# Patient Record
Sex: Female | Born: 1960 | Race: White | Hispanic: No | Marital: Married | Smoking: Former smoker
Health system: Southern US, Community
[De-identification: ages and names within clinical notes are randomized; demographics above are authoritative.]

## PROBLEM LIST (undated history)

## (undated) DIAGNOSIS — M1990 Unspecified osteoarthritis, unspecified site: Secondary | ICD-10-CM

## (undated) DIAGNOSIS — R112 Nausea with vomiting, unspecified: Secondary | ICD-10-CM

## (undated) DIAGNOSIS — I10 Essential (primary) hypertension: Secondary | ICD-10-CM

## (undated) DIAGNOSIS — Z9889 Other specified postprocedural states: Secondary | ICD-10-CM

## (undated) DIAGNOSIS — G4730 Sleep apnea, unspecified: Secondary | ICD-10-CM

## (undated) DIAGNOSIS — J479 Bronchiectasis, uncomplicated: Secondary | ICD-10-CM

## (undated) DIAGNOSIS — F329 Major depressive disorder, single episode, unspecified: Secondary | ICD-10-CM

## (undated) DIAGNOSIS — I729 Aneurysm of unspecified site: Secondary | ICD-10-CM

## (undated) DIAGNOSIS — F32A Depression, unspecified: Secondary | ICD-10-CM

## (undated) DIAGNOSIS — Z8719 Personal history of other diseases of the digestive system: Secondary | ICD-10-CM

## (undated) DIAGNOSIS — H539 Unspecified visual disturbance: Secondary | ICD-10-CM

## (undated) HISTORY — PX: ABDOMINAL HYSTERECTOMY: SHX81

## (undated) HISTORY — PX: TUBAL LIGATION: SHX77

## (undated) HISTORY — PX: HIP FRACTURE SURGERY: SHX118

## (undated) HISTORY — PX: TONSILLECTOMY: SUR1361

## (undated) HISTORY — DX: Unspecified visual disturbance: H53.9

## (undated) HISTORY — PX: JOINT REPLACEMENT: SHX530

## (undated) HISTORY — PX: HAND SURGERY: SHX662

## (undated) HISTORY — PX: CYSTOSCOPY: SUR368

## (undated) HISTORY — PX: CHOLECYSTECTOMY: SHX55

---

## 2001-05-07 ENCOUNTER — Encounter: Admission: RE | Admit: 2001-05-07 | Discharge: 2001-05-07 | Payer: Self-pay | Admitting: *Deleted

## 2001-05-07 ENCOUNTER — Encounter: Payer: Self-pay | Admitting: Neurological Surgery

## 2003-04-07 ENCOUNTER — Encounter: Admission: RE | Admit: 2003-04-07 | Discharge: 2003-04-07 | Payer: Self-pay | Admitting: *Deleted

## 2004-04-12 ENCOUNTER — Encounter: Admission: RE | Admit: 2004-04-12 | Discharge: 2004-04-12 | Payer: Self-pay | Admitting: *Deleted

## 2006-05-29 ENCOUNTER — Ambulatory Visit (HOSPITAL_BASED_OUTPATIENT_CLINIC_OR_DEPARTMENT_OTHER): Admission: RE | Admit: 2006-05-29 | Discharge: 2006-05-29 | Payer: Self-pay | Admitting: Orthopedic Surgery

## 2006-09-10 ENCOUNTER — Ambulatory Visit: Payer: Self-pay | Admitting: Emergency Medicine

## 2006-10-18 ENCOUNTER — Ambulatory Visit: Payer: Self-pay | Admitting: Emergency Medicine

## 2006-11-04 DIAGNOSIS — R059 Cough, unspecified: Secondary | ICD-10-CM | POA: Insufficient documentation

## 2006-11-04 DIAGNOSIS — Z9089 Acquired absence of other organs: Secondary | ICD-10-CM

## 2006-11-04 DIAGNOSIS — M5136 Other intervertebral disc degeneration, lumbar region: Secondary | ICD-10-CM

## 2006-11-04 DIAGNOSIS — R05 Cough: Secondary | ICD-10-CM

## 2006-11-04 DIAGNOSIS — Z9079 Acquired absence of other genital organ(s): Secondary | ICD-10-CM | POA: Insufficient documentation

## 2006-11-04 DIAGNOSIS — R0609 Other forms of dyspnea: Secondary | ICD-10-CM | POA: Insufficient documentation

## 2006-11-04 DIAGNOSIS — R0602 Shortness of breath: Secondary | ICD-10-CM

## 2006-11-04 DIAGNOSIS — J329 Chronic sinusitis, unspecified: Secondary | ICD-10-CM | POA: Insufficient documentation

## 2006-11-04 DIAGNOSIS — E669 Obesity, unspecified: Secondary | ICD-10-CM

## 2006-11-13 ENCOUNTER — Ambulatory Visit: Payer: Self-pay | Admitting: Emergency Medicine

## 2006-11-22 ENCOUNTER — Ambulatory Visit: Payer: Self-pay | Admitting: Emergency Medicine

## 2006-12-13 ENCOUNTER — Encounter: Admission: RE | Admit: 2006-12-13 | Discharge: 2006-12-13 | Payer: Self-pay | Admitting: Family Medicine

## 2006-12-24 ENCOUNTER — Ambulatory Visit (HOSPITAL_COMMUNITY): Admission: RE | Admit: 2006-12-24 | Discharge: 2006-12-24 | Payer: Self-pay | Admitting: Orthopedic Surgery

## 2007-02-07 ENCOUNTER — Encounter (INDEPENDENT_AMBULATORY_CARE_PROVIDER_SITE_OTHER): Payer: Self-pay | Admitting: Orthopedic Surgery

## 2007-02-07 ENCOUNTER — Ambulatory Visit (HOSPITAL_BASED_OUTPATIENT_CLINIC_OR_DEPARTMENT_OTHER): Admission: RE | Admit: 2007-02-07 | Discharge: 2007-02-08 | Payer: Self-pay | Admitting: Orthopedic Surgery

## 2007-10-14 ENCOUNTER — Encounter: Admission: RE | Admit: 2007-10-14 | Discharge: 2007-10-14 | Payer: Self-pay | Admitting: Orthopedic Surgery

## 2009-10-07 ENCOUNTER — Encounter
Admission: RE | Admit: 2009-10-07 | Discharge: 2009-10-07 | Payer: Self-pay | Admitting: Physical Medicine and Rehabilitation

## 2010-05-08 ENCOUNTER — Encounter (HOSPITAL_BASED_OUTPATIENT_CLINIC_OR_DEPARTMENT_OTHER)
Admission: RE | Admit: 2010-05-08 | Discharge: 2010-05-08 | Disposition: A | Discharge status: Home / Self Care | Payer: BC Managed Care – PPO | Source: Ambulatory Visit | Attending: Orthopedic Surgery | Admitting: Orthopedic Surgery

## 2010-05-08 LAB — BASIC METABOLIC PANEL
BUN: 8 mg/dL (ref 6–23)
Chloride: 102 mEq/L (ref 96–112)
GFR calc Af Amer: >60 mL/min (ref >60)
Glucose, Bld: 99 mg/dL (ref 70–99)
Sodium: 137 mEq/L (ref 135–145)

## 2010-05-09 ENCOUNTER — Other Ambulatory Visit: Payer: Self-pay | Admitting: Orthopedic Surgery

## 2010-05-09 ENCOUNTER — Ambulatory Visit (HOSPITAL_BASED_OUTPATIENT_CLINIC_OR_DEPARTMENT_OTHER)
Admission: RE | Admit: 2010-05-09 | Discharge: 2010-05-09 | Disposition: A | Discharge status: Home / Self Care | Payer: BC Managed Care – PPO | Source: Ambulatory Visit | Attending: Orthopedic Surgery | Admitting: Orthopedic Surgery

## 2010-05-09 DIAGNOSIS — E669 Obesity, unspecified: Secondary | ICD-10-CM | POA: Insufficient documentation

## 2010-05-09 DIAGNOSIS — J449 Chronic obstructive pulmonary disease, unspecified: Secondary | ICD-10-CM | POA: Insufficient documentation

## 2010-05-09 DIAGNOSIS — K219 Gastro-esophageal reflux disease without esophagitis: Secondary | ICD-10-CM | POA: Insufficient documentation

## 2010-05-09 DIAGNOSIS — J4489 Other specified chronic obstructive pulmonary disease: Secondary | ICD-10-CM | POA: Insufficient documentation

## 2010-05-09 DIAGNOSIS — M65849 Other synovitis and tenosynovitis, unspecified hand: Secondary | ICD-10-CM | POA: Insufficient documentation

## 2010-05-09 DIAGNOSIS — M6740 Ganglion, unspecified site: Secondary | ICD-10-CM | POA: Insufficient documentation

## 2010-05-09 DIAGNOSIS — I10 Essential (primary) hypertension: Secondary | ICD-10-CM | POA: Insufficient documentation

## 2010-05-09 DIAGNOSIS — Z01812 Encounter for preprocedural laboratory examination: Secondary | ICD-10-CM | POA: Insufficient documentation

## 2010-05-09 DIAGNOSIS — M65839 Other synovitis and tenosynovitis, unspecified forearm: Secondary | ICD-10-CM | POA: Insufficient documentation

## 2010-06-20 NOTE — Assessment & Plan Note (Signed)
[facility]                             PULMONARY OFFICE NOTE   [name] [name], [name] [name]                 MRN:          [id number]  DATE:11/22/2006                            DOB:          03-14-1960    The patient is a 50 year old woman who follows up today for her chronic  cough and her exertional shortness of breath.  She had been treated for  possible mild asthma with Foradil and Qvar.  We performed spirometry and  we obtained her pulmonary function test from [facility].  As there was  no evidence of air flow limitation, her asthma regimen was discontinued  at our last visit.  We have also made efforts to treat her postnasal  drip more aggressively.  She has been using nasal saline washes as well  as Nasacort and Claritin.  She returns today telling me that her cough  is much improved, her breathing is doing well.   CURRENT MEDICATIONS:  1. Paxil 10 mg daily.  2. Verapamil 180 mg daily.  3. Benzonatate 200 mg p.r.n.  4. Omeprazole 20 mg daily.  5. Claritin 10 mg daily.  6. Nasacort AQ two sprays each nostril daily.  7. Nasal saline washes once daily.  8. Proair two puffs q.4 hours p.r.n. for shortness of breath.   PHYSICAL EXAMINATION:  GENERAL:  This is a very pleasant overweight  woman who is in no distress on room air.  VITAL SIGNS:  Weight is 250 pounds, temperature 97.6, blood pressure  118/74, heart rate 92, SPO2 98% on room air.  HEENT:  Her oropharynx is narrow with some mild erythema.  She has a  slightly hoarse voice, but this is improved compared with our previous  examination.  NECK:  Without any stridor or lymphadenopathy.  LUNGS:  Clear to auscultation bilaterally.  There is no wheeze on forced  expiration.  HEART:  Regular without murmur.  ABDOMEN:  Obese, soft, nontender, positive bowel sounds.  EXTREMITIES:  No cyanosis, clubbing, or edema.  NEUROLOGY:  She has a nonfocal examination.   IMPRESSION:  Chronic cough due to  primarily allergic rhinitis with a  possible contribution of gastroesophageal reflux disease.  I will  continue her current regimen that includes Claritin, Nasacort, and nasal  saline washes.  She will also continue her Omeprazole as ordered.  I do  not believe she needs a standing bronchodilator.  She will continue to  have her Proair available in the event that she does develop wheeze or  dyspnea.    Leslye Peer, MD  Electronically Signed   RSB/MedQ  DD: 12/15/2006  DT: 12/15/2006  Job #: [id number]

## 2010-06-20 NOTE — Assessment & Plan Note (Signed)
[facility]                             PULMONARY OFFICE NOTE   SUSSAN, METER                 MRN:          [id number]  DATE:10/18/2006                            DOB:          12/10/1960    SUBJECTIVE:  Ms. Fennel is a 50 year old woman who presents in  followup regarding her chronic cough and her exertional dyspnea.  We  first met in August and I asked her to start taking omeprazole regularly  and I added empiric Claritin for treatment of possible postnasal drip as  the cause of her cough.  At that time, she had already undergone some  evaluation in [facility] and had been started empirically on a regimen  for asthma including Foradil and QVAR.  She returns today telling me  that her postnasal drip may be somewhat improved since she was started  on Claritin.  She does still have cough every day, however.  The cough  may be a little bit better in that it is less frequent.  Since our last  visit her breathing is improved.  Her data from [facility] is now  available and is reviewed below.   CURRENT MEDICINES:  1. Foradil 1 inhalation b.i.d.  2. QVAR 80 mcg 1 inhalation b.i.d.  3. Paxil 10 mg daily.  4. Verapamil 180 mg daily.  5. Benzonatate 200 mg t.i.d. p.r.n.  6. Omeprazole 20 mg daily.  7. Claritin 10 mg daily.  8. Nasacort AQ 2 sprays to each nostril daily.  9. ProAir 2 puffs q.4 h. p.r.n. for shortness of breath.   PHYSICAL EXAMINATION:  GENERAL:  This is a pleasant, overweight woman in  no distress on room air.  VITAL SIGNS:  Her weight is 249 pounds.  Temperature is 98.8, blood  pressure 134/86, heart rate 93, SpO2 96% on room air.  HEENT:  Oropharynx is somewhat narrowed with some mild posterior  pharyngeal erythema.  She has a slightly hoarse voice.  NECK:  Without any stridor or lymphadenopathy.  LUNGS:  Clear to auscultation bilaterally.  There is no wheezing on  forced expiration.  HEART:  Regular without murmur.  ABDOMEN:  Obese, soft, nontender.  Positive bowel sounds.  EXTREMITIES:  Significant for some very mild pretibial edema.  NEUROLOGIC:  She has a nonfocal exam.   Pulmonary function testing is available from August 16, 2006, performed at  [facility].  This study showed normal air flow.  There was a  slight decrease in her FEF 25-75%, which may be why she was treated  empirically for possible asthma.  Her lung volumes were normal and her  diffusion capacity was normal.  A repeat spirometry in the office today,  pre-bronchodilator, confirms normal air flow with an FEV1 of 2.88 or  105% of predicted.   IMPRESSIONS AND PLANS:  1. Chronic cough.  I do believe this is multifactorial but she      continues to have significant postnasal drip and this is likely the      most significant contributor.  She may also be having some upper      airway irritation  due to her current asthma regimen.  I will stop      her QVAR and Foradil.  We will add nasal saline washes once daily.      I will continue her Nasacort and Claritin as ordered as well as her      empiric omeprazole.  2. Improved dyspnea with questionable mild asthma.  Her pulmonary      function testing at [facility] and also her spirometry here today      did not show airflow limitation.  I would like to confirm that      there is no asthma present by performing pre and post      bronchodilator spirometry.  I will follow up with her in 4-6 weeks      to review those results and to assess her cough.     Leslye Peer, MD  Electronically Signed    RSB/MedQ  DD: 11/14/2006  DT: 11/14/2006  Job #: [id number]

## 2010-06-20 NOTE — Assessment & Plan Note (Signed)
[facility]                             PULMONARY OFFICE NOTE   [name], [name]                 MRN:          [id number]  DATE:09/10/2006                            DOB:          1960-06-14    REASON FOR CONSULTATION:  This is a self-referral by [name] [name] for  dyspnea and possible asthma with chronic cough.   HISTORY OF PRESENT ILLNESS:  [name] [name] is a 50 year old woman with  history of lumbar disk disease, cholecystectomy, hysterectomy, tubal  ligation, and possible asthma, who states that she was well until about  7 to 10 years ago. She developed a cough at that time, which was rarely  productive and which occurred every day. She also had symptoms at night  and they wee particularly bothersome at night, waking her up from sleep.  She was intermittently producing purulent sputum and was treated  frequently for bronchitis. She was seen by several doctors at that time  and the diagnosis of asthma was made by an allergist. Skin testing  confirmed that she was allergic to standard allergens such as dust, dust  mites, mold, etc. She was also evaluated by ENT and did not have an  anatomical explanation for her cough. She has been treated with multiple  different regimens to address both cough and dyspnea, that has evolved  over the last several years. The dyspnea is quite severe at this time  and is most bothersome with exertion. She can climb one flight of stairs  before she has to stop to rest. Her treatment has included empiric  therapy for possible gastroesophageal reflux with Omeprazole. She has  also been treated for possible post-nasal drip. In 2006, she was  evaluated by a pulmonologist in [facility], who did perform pulmonary  function testing and diagnosed her with possible early COPD with  reported air flow limitation. She continues to have a dry cough and it  is more productive. During the last year, she has produced  yellowish to  clear phlegm, sometimes producing chunks of sputum. She denies any  wheezing. She has had a weight gain from 180 pounds to 245 pounds over  the last 10 years. This has been a steady unintentional weight gain. She  snores. She has not had any witnessed apnea. She denies any daytime  sleepiness. She does not take naps. Her current regimen has included  Foradil and Q-Var. She tells me that she has not been using the Foradil  reliably and she has not noticed any significant change in the breathing  or her cough.   PAST MEDICAL HISTORY:  1. Lumbar disk disease with a ruptured disk. She did not require      surgery.  2. Cholecystectomy May of 2000.  3. Hysterectomy May 2008.  4. Tubal ligation in 1984.   ALLERGIES:  NO KNOWN DRUG ALLERGIES.   CURRENT MEDICATIONS:  1. Foradil 1 inhalation b.i.d. She is not taking this medication      regularly and uses it more on an as needed basis.  2. Q-Var 80 1 inhalation b.i.d.  3. Paxil 10 mg daily.  4.  Verapamil 180 mg daily.  5. Singulair 10 mg daily.  6. Benzonatate 200 mg t.i.d.  7. Omeprazole 20 mg daily.  8. Pro-Air 2 puffs q.4 hours p.r.n. shortness of breath.   SOCIAL HISTORY:  The patient is married. Originally from [place] and  continues to live in [place]. 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 She is a former smoker  with a 5 to 10 pack year total history. She quit in 1983. She denies any  other significant occupational exposures. She has never been exposed to  tuberculosis.   FAMILY HISTORY:  Significant for chronic obstructive pulmonary disease  in her father, coronary artery disease in both parents.   REASON FOR ADMISSION:  Is as per HPI. Also, please note that she has had  some irregular heart beats.   HABITS:  GENERAL:  An obese woman who is in no distress.  VITAL SIGNS:  Weight 245 pounds.  Temperature 98.1, blood pressure  140/92, heart rate 95. SPO2 97% on room air.  HEENT:  Oropharynx is narrow with some mild erythema. She has a hoarse  voice.  NECK:  Without any stridor or lymphadenopathy.  LUNGS:  Clear to auscultation bilaterally. She does not wheeze on a  forced expiration.  HEART:  Regular rate and rhythm. Without murmur.  ABDOMEN:  Obese, soft, nontender with positive bowel sounds.  EXTREMITIES:  Trace pre-tibial edema.  NEUROLOGIC:  Non-focal examination.   IMPRESSION:  1. Chronic cough. This may be due to multiple influences including      component of gastroesophageal reflux disease, post-nasal drip and      allergic rhinitis, and possible true air flow limitation. It is not      clear to me that she has ever been on therapy to cover the common      upper airway irritants simultaneously.  2. Dyspnea. I question whether she may have reactive airway disease.      Also must consider possible influence of her obesity. She has      gained 65 pounds over the last 10 years. Finally, I would consider      possible evolving pulmonary  hypertension, particularly if she is a      hypo-ventilator, or if she has sleep apnea. She does not have any      clear symptoms of stress sleep apnea at this time but I would      consider a polysomnogram in the future.   PLAN:  1. I have asked her to take her Omeprazole 20 mg daily every day,      reliably. She had been using it on an as needed basis.  2. She will start empiric Claritin 10 mg daily and Nasacort AQ 2      sprays to each nostril daily to address any component of post-nasal      drip.  3. I will obtain her pulmonary function tests and her chest x-rays      from [facility] to review.  4. I will not change her Foradil or her Q-Var at this time but we may      decide to adjust her      medications once her data is received.  5. I will followup with Ms. Cast in 1 month to review her      studies and to assess her  cough once we treat her GERD and  post-      nasal drip.     Leslye Peer, MD  Electronically Signed    RSB/MedQ  DD: 09/11/2006  DT: 09/11/2006  Job #: [id number]

## 2010-06-20 NOTE — Op Note (Signed)
NAMEZENIA, GUEST          ACCOUNT NO.:  [id number]   MEDICAL RECORD NO.:  [id number]          PATIENT TYPE:  AMB   LOCATION:  DSC                          FACILITY:  [facility]   PHYSICIAN:  Cindee Salt, M.D.       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   OPERATION:  Carpal tunnel release with resection of the ulnar artery  aneurysm with the reverse vein graft, sympathectomies common digital  arteries and superficial palmar arch, left hand.   SURGEON:  Cindee Salt, M.D.   ASSISTANT:  None.   ANESTHESIA:  General.   ANESTHESIOLOGIST:  Cruz.   HISTORY:  The patient is a 50 year old female with a history of  numbness, tingling, vascular changes on her index finger.  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  This is now old.  EMG nerve conductions reveal carpal  tunnel syndrome.  This has not responded entirely to conservative  treatment.  She is desirous of proceeding to have the carpal tunnel  released, the ulnar artery aneurysm resected and vein graft if necessary  along with sympathectomies to her left hand.  She is aware that there is  no guarantee with surgery, possibility of infection, recurrence, injury  to arteries, nerves, tendons, incomplete relief of symptoms, dystrophy.   In the preoperative area the patient is seen and the extremity marked by  both the patient and surgeon.  Questions again were encouraged and  answered.  Antibiotic given.   PROCEDURE:  The patient was brought to the operating room where a  general anesthetic was carried out without difficulty.  She was prepped  using DuraPrep in the supine position, left arm free.   The limb was  exsanguinated with an Esmarch bandage, tourniquet placed high on the arm  and was inflated to 250 mm Hg.  A an incision was made palmarly and  taken down through subcutaneous tissue.  This was for a typical carpal  tunnel release and brought to the ulnar side of her wrist and down onto  the volar forearm proximal to the pisiform.  Prior to doing this, large  veins on the volar aspect of her wrists were marked.   The dissection was carried down.  The palmar fascia was split.  The  superficial palmar arch was identified.  The flexor tendon of the ring  and little finger were identified to the ulnar side of the median nerve.  Carpal retinaculum was incised with sharp dissection, releasing the  median nerve.  Guyon's canal was then released.  The arterial aneurysm  of the ulnar artery was immediately identified with a corkscrew  appearance.  The wound was extended distally in a T-shaped manner to  allow visualization of the superficial palmar arch and common digital  arteries.   The operative microscope  was then brought into position.  A  sympathectomy was then performed over the entire palmar arch and out  onto the common digital arteries to the level was their bifurcations on  each finger.  The aneurysm was then resected.  This had to be resected  slightly greater distally than anticipated until normal intima was  identified.  The specimen was sent to pathology.  Attempt was made to  see if this could be repaired.  However, the gap was too great.  This  measured approximately 2.5 to almost 3 cm in distance.  A separate  incision was then made on the volar forearm.  A vein was harvested of  approximately 1.5 to 2 cm in length, slightly smaller than the ulnar  artery.  This was then reversed, brought into position and a repair was  then performed with the back-wall-first technique proximally.  The  artery was clamped after resection.  The proximal clamp was released and  the  vein graft immediately filled.  This was reapplied.  The graft was  irrigated and dilated.  The distal margin was then repaired again with  interrupted 9-0 nylon sutures using the back-wall-first technique.   A micro needle was lost during the procedure.  X-rays were taken  confirming that it was not in the wound or in the hand.  The tourniquet  was deflated with pressure held over the vein graft.  Immediate filling  of all fingers and thumb occurred.  Once the reactive hyperemia abated,  the graft was inspected and found be pulsatile with good flow through  both proximal and distal anastomoses.   The wound was irrigated with saline.  The wound was then closed  interrupted 5-0 Vicryl Rapide sutures.  A sterile compressive dressing  and splint was applied.  The patient tolerated the procedure well and  was taken to the recovery room for observation in satisfactory  condition.  She will be been admitted for overnight stay.  She will be  discharged on aspirin and Percocet.           ______________________________  Cindee Salt, M.D.     GK/MEDQ  D:  02/07/2007  T:  02/07/2007  Job:  [id number]

## 2010-06-23 NOTE — Op Note (Signed)
NAMEAMILAH, GREENSPAN          ACCOUNT NO.:  [id number]   MEDICAL RECORD NO.:  [id number]          PATIENT TYPE:  AMB   LOCATION:  DSC                          FACILITY:  [facility]   PHYSICIAN:  Feliberto Gottron. Turner Daniels, M.D.   DATE OF BIRTH:  1960-06-23   DATE OF PROCEDURE:  05/29/2006  DATE OF DISCHARGE:                               OPERATIVE REPORT   PREOPERATIVE DIAGNOSIS:  Left knee possible medial meniscal tear versus  chondromalacia versus loose body.   POSTOPERATIVE DIAGNOSIS:  Left knee medial meniscal tear, chondromalacia  medial femoral condyle and patella and a cartilaginous loose body.   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   SURGEON:  Feliberto Gottron. Turner Daniels, M.D.   ASSISTANT:  None.   ANESTHETIC:  General LMA.  Local anesthetic at the end.   ESTIMATED BLOOD LOSS:  Minimal.   FLUID REPLACEMENT:  600 mL crystalloid.   DRAINS PLACED:  None.   TOURNIQUET TIME:  None   INDICATIONS FOR PROCEDURE:  The patient is a 50 year old woman with left  knee pain that began when she was doing some walking with her dog and  extended her knee backward back in the fall of 2007.  She reinjured it a  week ago doing some ballroom dancing and felt her knee extend backward.  Initially she did not have much swelling, but the second time when she  was ballroom dancing she did notice some swelling.  She has a positive  medial joint line pain, positive McMurray's test.  The pain wakes her  from sleep, weightbearing makes it worse.  Meloxicam helped a little bit  and the pain has been unrelenting over the last couple of weeks.  After  careful evaluation in the office including x-rays that did not show any  obvious abnormality, because of her mechanical symptoms that are  recurrent and getting worse, she is taken for  arthroscopic evaluation  and treatment of her left knee with a presumed medial meniscal tear  and/or loose body with chondromalacia.  Risks and benefits of surgery  discussed, questions answered.   DESCRIPTION OF PROCEDURE:  The patient identified by armband, taken to  the operating room at [facility] day surgery center where the appropriate  anesthetic monitors were attached and general LMA anesthesia induced  with the patient in supine position.  Lateral post applied to the table  and the left lower extremity prepped, draped in sterile fashion from the  ankle to the lateral post. We began the procedure by making standard  inferomedial and inferolateral parapatellar portals allowing  introduction of the arthroscope through the inferolateral portal,  outflow through the inferomedial portal.  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  We  also directed our attention to the trochlea which had some grade 2 to  grade 3 chondromalacia requiring debridement as did the medial femoral  condyle.  Moving into the medial compartment, posterior medial horn of  the medial meniscus had a degenerative frayed tear and this was debrided  back to stable margin.  We also encountered a cartilaginous loose body  that was removed with the 3.5 gator sucker shaver.  A second loose body  was found in the notch region near the medial tibial spine.  This was  also removed.  The lateral compartment appeared to be in excellent  condition with no tearing or fraying of the menisci or the articular  cartilages.  The gutters were cleared medially and laterally.  The knee  was irrigated out with normal saline solution and the arthroscopic  instruments were removed.  Photographic documentation was made of the  damage seen  before and after surgical intervention.  At this point  local anesthetic using half  percent Marcaine and epinephrine solution  about 10 mL was infiltrated into the medial and lateral portals about 3  mL each and then 4 mL into the knee itself.  A dressing of Xeroform, 4x4  dressing sponges, Webril and Ace wrap was applied.  The patient was then  awakened and taken to the recovery room without difficulty.      Feliberto Gottron. Turner Daniels, M.D.  Electronically Signed     FJR/MEDQ  D:  05/29/2006  T:  05/29/2006  Job:  [id number]

## 2010-07-26 ENCOUNTER — Emergency Department (HOSPITAL_COMMUNITY): Payer: BC Managed Care – PPO

## 2010-07-26 ENCOUNTER — Inpatient Hospital Stay (HOSPITAL_COMMUNITY)
Admission: EM | Admit: 2010-07-26 | Discharge: 2010-07-30 | DRG: 211 | Disposition: A | Discharge status: Home Health | Payer: BC Managed Care – PPO | Attending: Orthopaedic Surgery | Admitting: Orthopaedic Surgery

## 2010-07-26 DIAGNOSIS — S72033A Displaced midcervical fracture of unspecified femur, initial encounter for closed fracture: Principal | ICD-10-CM | POA: Diagnosis present

## 2010-07-26 DIAGNOSIS — Y998 Other external cause status: Secondary | ICD-10-CM

## 2010-07-26 DIAGNOSIS — Z90710 Acquired absence of both cervix and uterus: Secondary | ICD-10-CM

## 2010-07-26 DIAGNOSIS — J45909 Unspecified asthma, uncomplicated: Secondary | ICD-10-CM | POA: Diagnosis present

## 2010-07-26 DIAGNOSIS — Y92009 Unspecified place in unspecified non-institutional (private) residence as the place of occurrence of the external cause: Secondary | ICD-10-CM

## 2010-07-26 DIAGNOSIS — E669 Obesity, unspecified: Secondary | ICD-10-CM | POA: Diagnosis present

## 2010-07-26 DIAGNOSIS — Y9389 Activity, other specified: Secondary | ICD-10-CM

## 2010-07-26 DIAGNOSIS — Z96649 Presence of unspecified artificial hip joint: Secondary | ICD-10-CM

## 2010-07-26 DIAGNOSIS — M109 Gout, unspecified: Secondary | ICD-10-CM | POA: Diagnosis present

## 2010-07-26 DIAGNOSIS — W010XXA Fall on same level from slipping, tripping and stumbling without subsequent striking against object, initial encounter: Secondary | ICD-10-CM | POA: Diagnosis present

## 2010-07-26 DIAGNOSIS — I10 Essential (primary) hypertension: Secondary | ICD-10-CM | POA: Diagnosis present

## 2010-07-26 LAB — COMPREHENSIVE METABOLIC PANEL
AST: 38 U/L — ABNORMAL HIGH (ref 0–37)
GFR calc Af Amer: >60 mL/min (ref >60)
GFR calc non Af Amer: >60 mL/min (ref >60)
Sodium: 143 mEq/L (ref 135–145)
Total Bilirubin: 0.5 mg/dL (ref 0.3–1.2)
Total Protein: 6.7 g/dL (ref 6.0–8.3)

## 2010-07-26 LAB — DIFFERENTIAL
Eosinophils Relative: 0 % (ref 0–5)
Lymphs Abs: 1.3 10*3/uL (ref 0.7–4.0)
Monocytes Absolute: 0.6 10*3/uL (ref 0.1–1.0)
Monocytes Relative: 4 % (ref 3–12)
Neutro Abs: 12.7 10*3/uL — ABNORMAL HIGH (ref 1.7–7.7)

## 2010-07-26 LAB — CBC
HCT: 38.7 % (ref 36.0–46.0)
MCHC: 33.3 g/dL (ref 30.0–36.0)
Platelets: 270 10*3/uL (ref 150–400)
RBC: 4.53 MIL/uL (ref 3.87–5.11)

## 2010-07-26 LAB — ABO/RH: ABO/RH(D): A POS

## 2010-07-26 LAB — TYPE AND SCREEN

## 2010-07-27 ENCOUNTER — Inpatient Hospital Stay (HOSPITAL_COMMUNITY): Payer: BC Managed Care – PPO

## 2010-07-27 LAB — URINALYSIS, ROUTINE W REFLEX MICROSCOPIC
Bilirubin Urine: NEGATIVE
Hgb urine dipstick: NEGATIVE
Ketones, ur: NEGATIVE mg/dL
Nitrite: NEGATIVE
Specific Gravity, Urine: 1.012 (ref 1.005–1.030)

## 2010-07-27 LAB — SURGICAL PCR SCREEN: Staphylococcus aureus: NEGATIVE

## 2010-07-28 LAB — CBC
MCH: 28.1 pg (ref 26.0–34.0)
MCV: 87.8 fL (ref 78.0–100.0)
Platelets: 281 10*3/uL (ref 150–400)
RDW: 13.1 % (ref 11.5–15.5)

## 2010-07-28 LAB — URINE CULTURE: Culture: NO GROWTH

## 2010-07-29 LAB — PROTIME-INR
INR: 1.21 (ref 0.00–1.49)
Prothrombin Time: 15.6 seconds — ABNORMAL HIGH (ref 11.6–15.2)

## 2010-07-30 LAB — PROTIME-INR: Prothrombin Time: 17.1 seconds — ABNORMAL HIGH (ref 11.6–15.2)

## 2010-08-01 NOTE — Op Note (Signed)
NAMEJAZZMA, NEIDHARDT NO.:  [id number]  MEDICAL RECORD NO.:  [id number]  LOCATION:  5012                         FACILITY:  [facility]  PHYSICIAN:  Vanita Panda. Magnus Ivan, M.D.DATE OF BIRTH:  [birth year]  DATE OF PROCEDURE:  07/27/2010 DATE OF DISCHARGE:                              OPERATIVE REPORT   PREOPERATIVE DIAGNOSIS:  Left displaced femoral neck fracture.  POSTOPERATIVE DIAGNOSIS:  Left displaced femoral neck fracture.  PROCEDURE:  Open reduction and internal fixation of left displaced femoral neck fracture using 3 cannulated screws.  SURGEON:  Vanita Panda. Magnus Ivan, MD  ANESTHESIA:  General.  BLOOD LOSS:  Less than 100 mL.  COMPLICATIONS:  None.  INDICATIONS:  [name] is a 50 year old female who was watering in the yard with her granddaughter when she sustained a mechanical fall. She was seen late last night in the emergency room and was found to have a displaced femoral neck fracture.  It was recommended that she  undergo open reduction and internal fixation of this fracture with possible completely opening the fracture for anatomic reduction.  It was felt that her joint capsule was probably already trashed due to the nature of this injury.  She did wish to proceed with surgery.  PROCEDURE DESCRIPTION:  After informed consent was obtained, appropriate left hip was marked.  She was brought to the emergency room.  General anesthesia was obtained while she was on the stretcher and then she was placed on the fracture table with a peroneal post placed and her right hip flexed and abducted out the field in a stirrup and then the left operative hip was placed inline skeletal traction and traction boot. Under direct fluoroscopic guidance, I then assessed the fracture and I was able to take off some traction and rotate and she appeared to be anatomically reduced under the C-arm.  I then had the left hip prepped and draped with DuraPrep and  sterile drapes.  A time-out was called to identify correct patient and correct left hip.  I made a small incision over the lateral thigh on the left side and I was able to place 3 guide pins in an inverted triangle formal from the lateral cortex traversing the fracture into the femoral head.  Through each of these, I then drilled and placed 6.5-mm cannulated screws from [employer] which is now [employer].  I felt the fracture was stable and after all 3 guide pins were removed, I put the hip through gentle internal and external rotation and it did move as a unit.  Under direct fluoroscopic guidance from the lateral to all the way to an AP plane, it appeared that the fracture was reduced with all screws traversing the femoral head.  I then made the decision not to pursue any further open reduction due to the nature of this break and I felt that joint capsule disrupted enough to decompress any further hematoma.  Of note, the patient does understand that this was high risk for still going on to need a hip replacement.  I thencopiously irrigated tissues and closed the deep tissue with 0 Vicryl followed by 2-0 Vicryl on subcutaneous tissue and interrupted staples on the skin.  Xeroform followed by well-padded sterile  dressing was applied.  She was taken off the fracture table awake and extubated and taken to the recovery room in stable condition.  All final counts were correct, and there were no complications noted.     Vanita Panda. Magnus Ivan, M.D.     CYB/MEDQ  D:  07/27/2010  T:  07/28/2010  Job:  [id number]  Electronically Signed by Doneen Poisson M.D. on 08/01/2010 12:28:07 PM

## 2010-08-18 NOTE — H&P (Signed)
NAMEJOSELYNNE, KILLAM NO.:  [id number]  MEDICAL RECORD NO.:  [id number]  LOCATION:  5012                         FACILITY:  [facility]  PHYSICIAN:  Burnard Bunting, M.D.    DATE OF BIRTH:  06/09/1960  DATE OF ADMISSION:  07/26/2010 DATE OF DISCHARGE:                             HISTORY & PHYSICAL   CHIEF COMPLAINT:  Left hip pain.  HISTORY OF PRESENT ILLNESS:  [name] [name] is a 50 year old female who was playing with her grandchildren today in the yard when she fell on her left hip.  She describes immediate onset of pain, inability to bear weight.  Slipped on wet grass landing on her left side when she was playing with her 2 grandchildren today.  Otherwise, denies any loss of consciousness.  Pain is severe.  She cannot weight bear or move her leg without pain.  PAST MEDICAL HISTORY: 1. Hypertension. 2. Ulnar artery aneurysm. 3. Gout.  PAST SURGICAL HISTORY:  Notable for hysterectomy, ulnar artery aneurysm dissection on the left, hip replacement on the right and resurfacing with posterior  approach.  CURRENT MEDICATIONS:  Allopurinol, amlodipine, Lasix, and Paxil.  ALLERGIES:  No known drug allergies.  REVIEW OF SYSTEMS:  All other systems reviewed and negative except the left hip.  SOCIAL HISTORY:  She is currently unemployed.  Does not smoke.  Does lives in town.  PHYSICAL EXAMINATION:  GENERAL:  She is well developed, well nourished, in no acute distress, alert and oriented. VITAL SIGNS:  Blood pressure is 160/90, heart rate is about 100, and respirations 16. CHEST:  Clear to auscultation. HEART:  Regular rate and rhythm. ABDOMEN:  Benign. EXTREMITIES:  She has a well-healed surgical incision over her right hip.  On the left-hand side, there is a slightly shorter external rotator.  Her DP pulses are intact.  Dorsiflexion and plantar flexion are intact.  Sensation is intact on the dorsal and plantar aspect of the left foot.  Left knee has no  crepitus or effusion.  Right hip, knee, and ankle full range of motion except there is effusion or bruising.  There is a surgical incision over the artery on the left hand.  RADIOGRAPHS:  EKG:  Normal sinus rhythm.  Radiographs showed displaced left femoral neck fracture.  CT of the hip shows one-half of anterior displacement, mildly impacted.  LABORATORY VALUES:  Sodium and potassium 140 and 4.0.  BUN and creatinine 12 and 0.73.  Serum protein 6.7.  White count is 14.8, hematocrit 36.7, and platelets 270.  Chest x-ray:  No airspace disease.  IMPRESSION:  Left hip fracture in a patient who is otherwise active. She has had a right total hip displacement.  No arthritis seen in the joint on today's radiographs.  PLAN:  The patient is nonambulatory but  currently unemployed.  She needs an attempt at hip salvage with open reduction and internal fixation.  We will try to do that with just traction.  We will likely need open anterior approach.  We will have Dr. Magnus Ivan assist.  Risks and benefits were discussed with the patient including but not limited to infection, nonunion, malunion, need for more surgery including possible hip placements, deep vein thrombosis, and death.  All  questions answered.  Medical decision making is complicated today by decision for surgery     G. Dorene Grebe, M.D.     GSD/MEDQ  D:  07/27/2010  T:  07/27/2010  Job:  [id number]  Electronically Signed by Reece Agar.  DEAN M.D. on 08/18/2010 05:37:29 PM

## 2010-08-25 NOTE — Op Note (Signed)
  [name], [name] [name]         ACCOUNT NO.:  [id number]  MEDICAL RECORD NO.:  [id number]          PATIENT TYPE:  LOCATION:                                 FACILITY:  PHYSICIAN:  Cindee Salt, M.D.            DATE OF BIRTH:  DATE OF PROCEDURE:  05/09/2010 DATE OF DISCHARGE:                              OPERATIVE REPORT   PREOPERATIVE DIAGNOSIS:  Stenosing tenosynovitis, left middle finger, with flexor sheath cyst.  POSTOPERATIVE DIAGNOSIS:  Stenosing tenosynovitis, left middle finger, with flexor sheath cyst.  OPERATION:  Release A1 pulley, left middle finger, with excision of flexor sheath cyst, left middle finger.  SURGEON:  Cindee Salt, MD.  ANESTHESIA:  Forearm based IV regional.  ANESTHESIOLOGIST:  Dr. Jean Rosenthal.  HISTORY:  The patient is a 50 year old female with a mass, triggering of her left middle finger.  This has not responded to conservative treatment.  She is elected to undergo excision of the mass with releaseof the A1 pulley, left middle finger.  Pre, peri, postoperative course have been discussed along with risks and complications.  She was seen in the preoperative area.  The extremity marked by both the patient and surgeon.  Antibiotic given.  PROCEDURE:  The patient was brought to the operating room where a forearm based IV regional anesthetic was carried out without difficulty. She was prepped using ChloraPrep, supine position with a left arm free. A 3-minute dry time was allowed.  Time-out taken, confirming the patient and procedure.  After adequate anesthesia was afforded, an oblique incision was made over the A1 pulley, left middle finger, carried down through subcutaneous tissue.  A large cyst was immediately encountered. Blunt and sharp dissection, this was dissected free.  The A1 pulley was then released on its radial aspect.  Partial synovectomy performed. Finger placed through a full range motion.  No further triggering was noted.  The wound was  irrigated.  The skin closed with interrupted 5-0 Vicryl Rapide sutures.  Local infiltration with 0.25% Marcaine was given, approximately 3 mL was used.  Sterile compressive dressing with fingers free was applied.  On deflation of the tourniquet, all fingers immediately pinked.  She was taken to the recovery room for observation in satisfactory condition.  She will be discharged home to return to the [facility] in 1 week on Vicodin.          ______________________________ Cindee Salt, M.D.    GK/MEDQ  D:  05/09/2010  T:  05/10/2010  Job:  [id number]  Electronically Signed by Cindee Salt M.D. on 08/25/2010 09:06:11 AM

## 2010-11-10 LAB — BASIC METABOLIC PANEL
BUN: 14
CO2: 27
Chloride: 104
Creatinine, Ser: 0.97
Glucose, Bld: 86

## 2010-11-14 LAB — CREATININE, SERUM: GFR calc Af Amer: >60

## 2010-11-14 LAB — CBC
MCHC: 34.3
MCV: 83.8
RBC: 4.78

## 2010-11-28 ENCOUNTER — Other Ambulatory Visit: Payer: Self-pay | Admitting: Orthopaedic Surgery

## 2010-11-28 DIAGNOSIS — M545 Low back pain: Secondary | ICD-10-CM

## 2010-12-03 ENCOUNTER — Ambulatory Visit
Admission: RE | Admit: 2010-12-03 | Discharge: 2010-12-03 | Disposition: A | Discharge status: Home / Self Care | Payer: BC Managed Care – PPO | Source: Ambulatory Visit | Attending: Orthopaedic Surgery | Admitting: Orthopaedic Surgery

## 2010-12-03 DIAGNOSIS — M545 Low back pain: Secondary | ICD-10-CM

## 2011-11-15 ENCOUNTER — Other Ambulatory Visit: Payer: Self-pay | Admitting: Orthopaedic Surgery

## 2011-11-15 DIAGNOSIS — M25552 Pain in left hip: Secondary | ICD-10-CM

## 2011-11-22 ENCOUNTER — Ambulatory Visit
Admission: RE | Admit: 2011-11-22 | Discharge: 2011-11-22 | Disposition: A | Discharge status: Home / Self Care | Payer: BC Managed Care – PPO | Source: Ambulatory Visit | Attending: Orthopaedic Surgery | Admitting: Orthopaedic Surgery

## 2011-11-22 DIAGNOSIS — M25552 Pain in left hip: Secondary | ICD-10-CM

## 2011-11-30 ENCOUNTER — Other Ambulatory Visit: Payer: Self-pay | Admitting: Orthopaedic Surgery

## 2011-11-30 ENCOUNTER — Ambulatory Visit
Admission: RE | Admit: 2011-11-30 | Discharge: 2011-11-30 | Disposition: A | Discharge status: Home / Self Care | Payer: BC Managed Care – PPO | Source: Ambulatory Visit | Attending: Orthopaedic Surgery | Admitting: Orthopaedic Surgery

## 2011-11-30 DIAGNOSIS — R52 Pain, unspecified: Secondary | ICD-10-CM

## 2014-08-07 IMAGING — CT CT HIP*L* W/O CM
3 of 5 series · 13 of 32 positions shown, 18 images · non-contrast
Comparison: MRI 11/22/2011.

CLINICAL DATA: Left hip fracture nonunion.  Assess bridging bone.
Fall 1 year ago.

CT OF THE LEFT HIP WITHOUT CONTRAST
TECHNIQUE: Multidetector CT imaging was performed according to the
standard protocol. Multiplanar CT image reconstructions were also
generated.

[Series 400: cor · coronal · 0.74mm/px · 5 of 64 slices shown, 10 images (1 of 2)]
[im 11/64  soft-tissue]
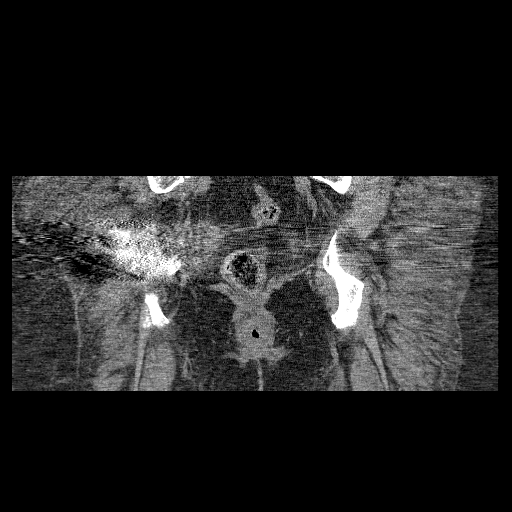
[im 11/64  lung]
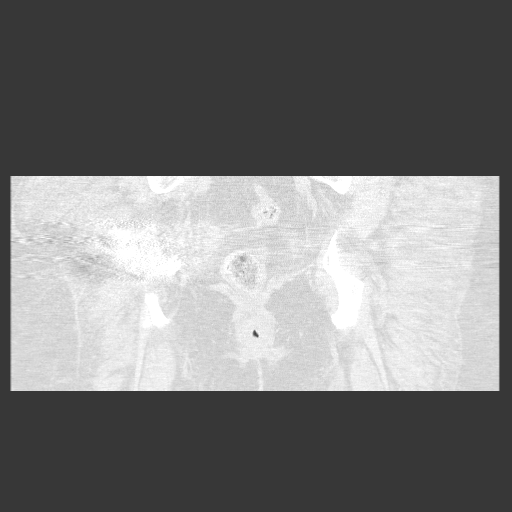
[im 11/64  bone]
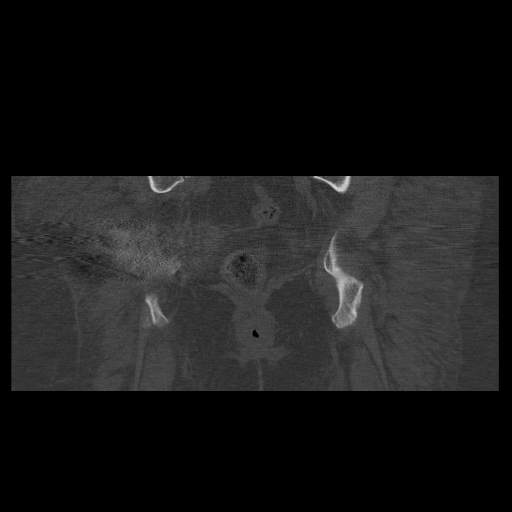
[im 22/64  soft-tissue]
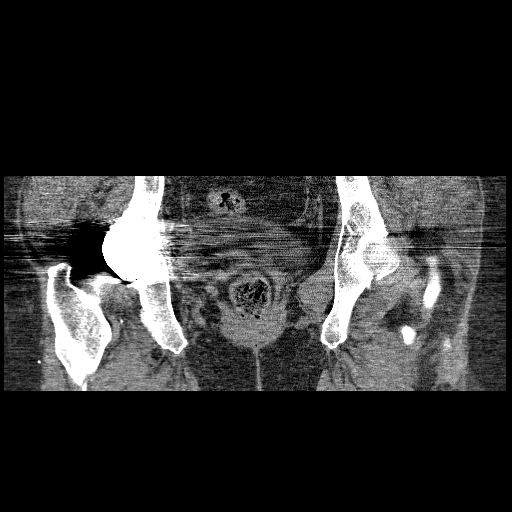
[im 22/64  lung]
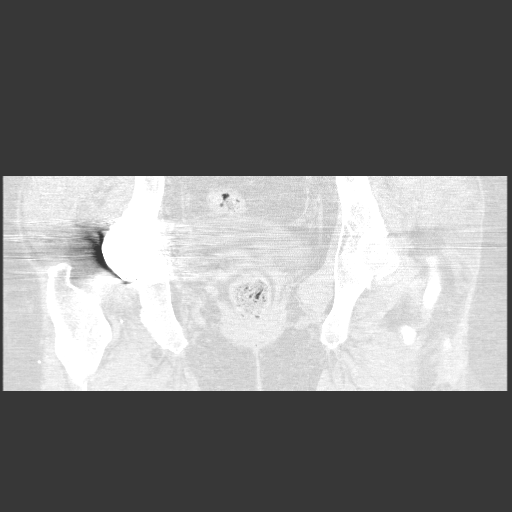
[im 32/64  soft-tissue]
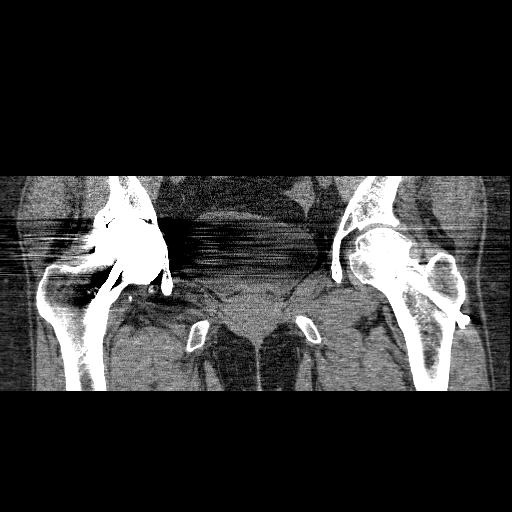
[im 32/64  lung]
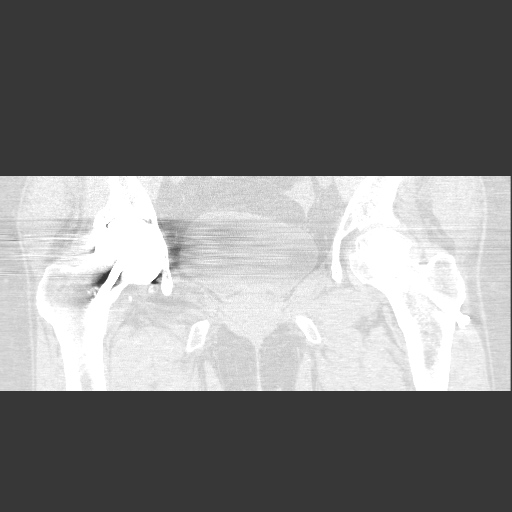
[im 43/64  soft-tissue]
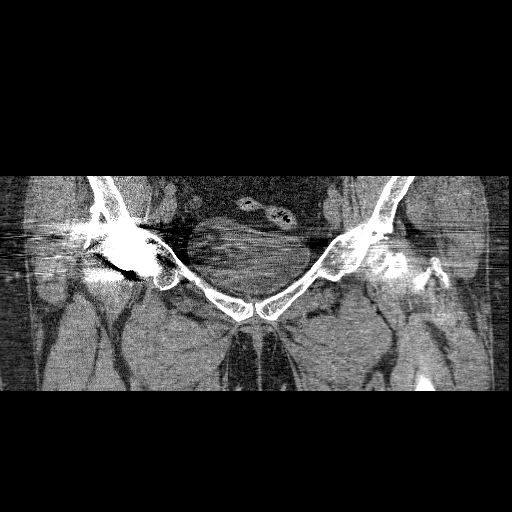
[im 43/64  lung]
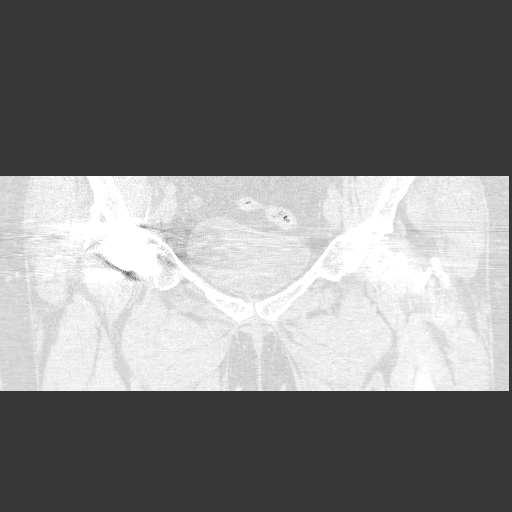
[im 53/64  soft-tissue]
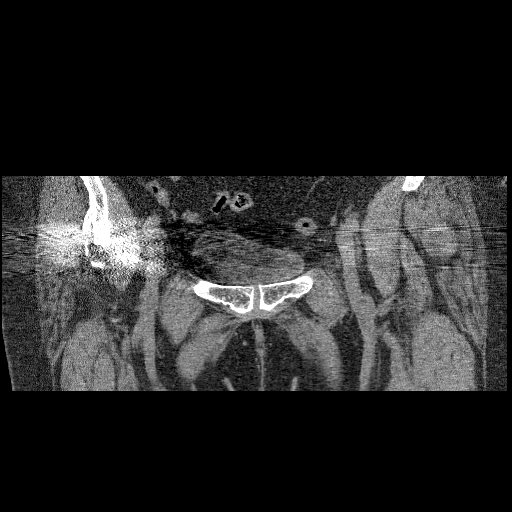

[Series 401: sag · sagittal · 0.59mm/px · 4 of 57 slices shown]
[im 12/57  soft-tissue]
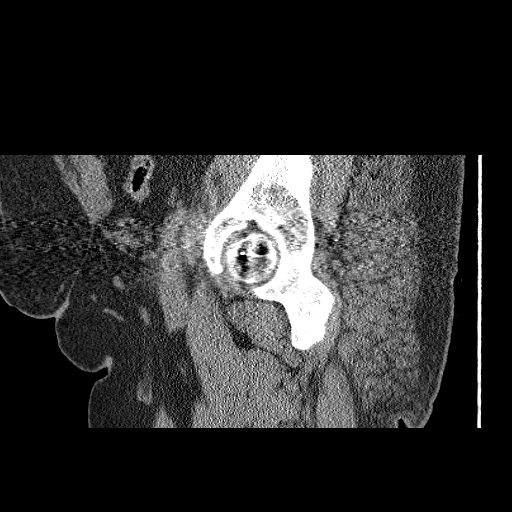
[im 23/57  soft-tissue]
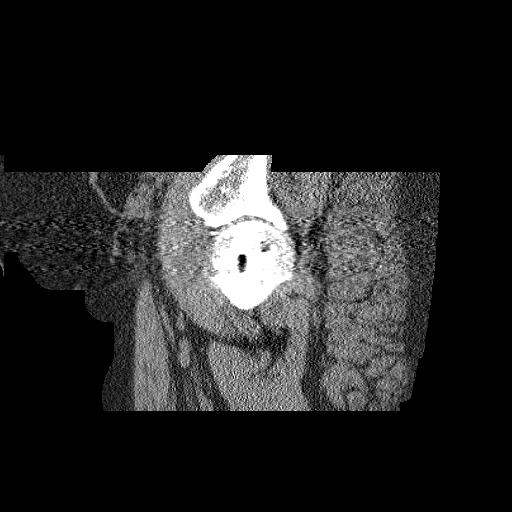
[im 34/57  soft-tissue]
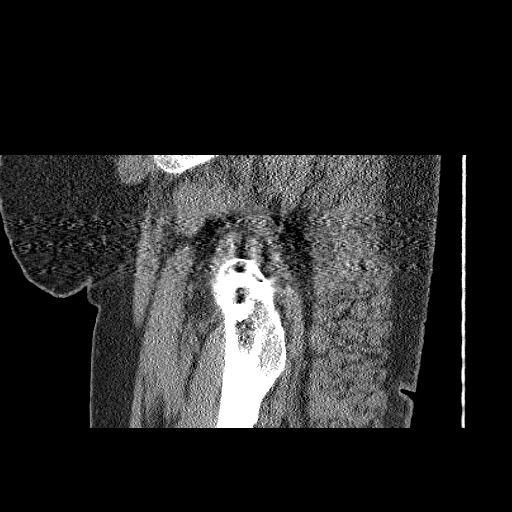
[im 45/57  soft-tissue]
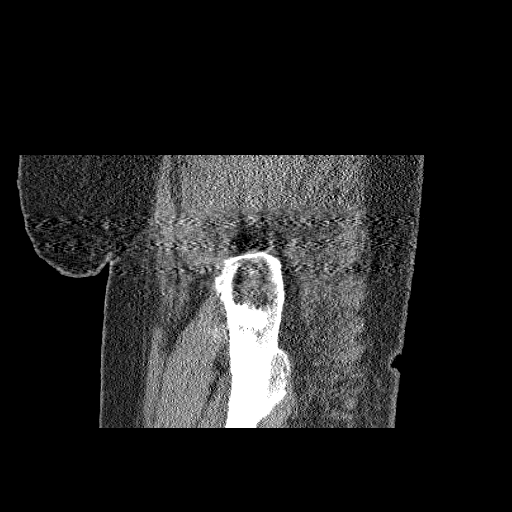

[Series 402: cor · coronal · 0.51mm/px · 4 of 57 slices shown (2 of 2)]
[im 12/57  soft-tissue]
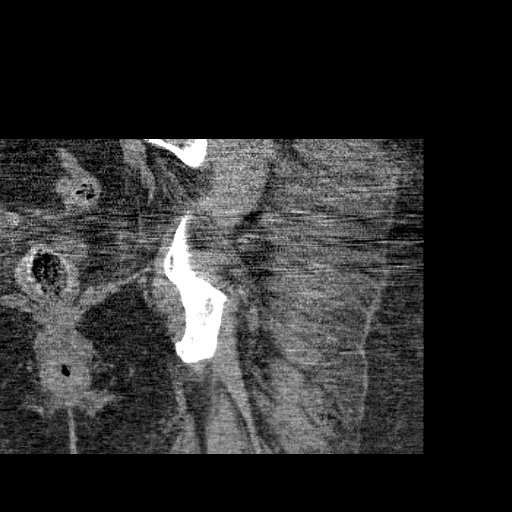
[im 23/57  soft-tissue]
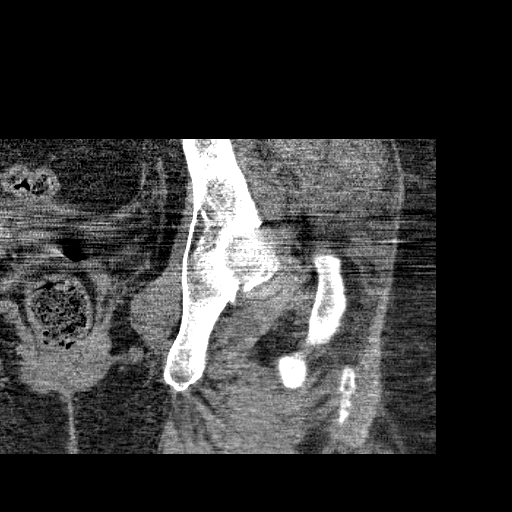
[im 34/57  soft-tissue]
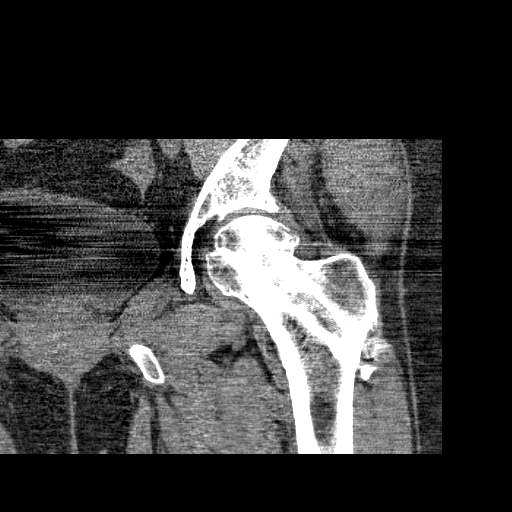
[im 45/57  soft-tissue]
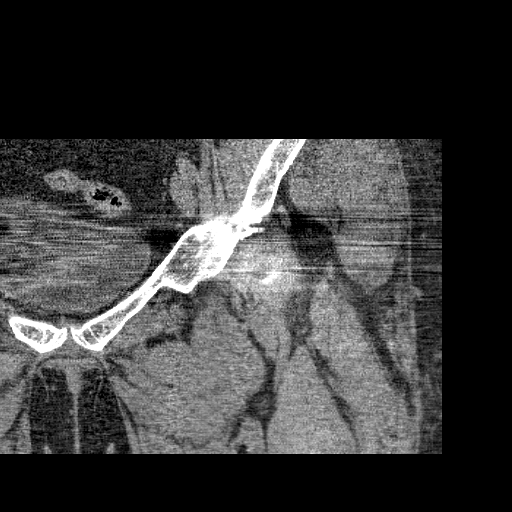

[13 of 32 positions shown; findings below may reference images not displayed]

FINDINGS: Three cannulated left hip screws are present.  There is
solid bridging bone across the subcapital left hip fracture.
Central portion of the fracture plane remains visible, noted on
MRI.  There is bridging bone at both the superior and inferior
margins with bridging remodeled cortex.  There is no AVN.  No
hardware complication is identified.  Tanu right hip
arthroplasty is present with heterotopic bone around the joint.
There is no osteolysis or complication identified at the right hip
arthroplasty.  Mild pubic symphysis degenerative disease.
Hysterectomy.  Visceral pelvis appears within normal limits
allowing for artifact.
IMPRESSION: 1.  Healed left subcapital femur fracture.  No hardware
complication.  The central fracture planes remain visible however
there is bridging bone at the periphery of the fracture.
2.  Uncomplicated right Tanu hip arthroplasty.

## 2015-09-30 ENCOUNTER — Other Ambulatory Visit: Payer: Self-pay

## 2015-10-04 ENCOUNTER — Other Ambulatory Visit: Payer: Self-pay | Admitting: Physician Assistant

## 2015-10-12 ENCOUNTER — Encounter (HOSPITAL_COMMUNITY): Payer: Self-pay | Admitting: *Deleted

## 2015-10-12 ENCOUNTER — Encounter (HOSPITAL_COMMUNITY)
Admission: RE | Admit: 2015-10-12 | Discharge: 2015-10-12 | Disposition: A | Discharge status: Home / Self Care | Payer: BLUE CROSS/BLUE SHIELD | Source: Ambulatory Visit | Attending: Orthopaedic Surgery | Admitting: Orthopaedic Surgery

## 2015-10-12 DIAGNOSIS — M1652 Unilateral post-traumatic osteoarthritis, left hip: Secondary | ICD-10-CM | POA: Insufficient documentation

## 2015-10-12 DIAGNOSIS — Z01818 Encounter for other preprocedural examination: Secondary | ICD-10-CM | POA: Insufficient documentation

## 2015-10-12 DIAGNOSIS — I10 Essential (primary) hypertension: Secondary | ICD-10-CM | POA: Diagnosis not present

## 2015-10-12 DIAGNOSIS — M879 Osteonecrosis, unspecified: Secondary | ICD-10-CM | POA: Diagnosis not present

## 2015-10-12 DIAGNOSIS — Z01812 Encounter for preprocedural laboratory examination: Secondary | ICD-10-CM | POA: Insufficient documentation

## 2015-10-12 HISTORY — DX: Aneurysm of unspecified site: I72.9

## 2015-10-12 HISTORY — DX: Unspecified osteoarthritis, unspecified site: M19.90

## 2015-10-12 HISTORY — DX: Other specified postprocedural states: Z98.890

## 2015-10-12 HISTORY — DX: Essential (primary) hypertension: I10

## 2015-10-12 HISTORY — DX: Depression, unspecified: F32.A

## 2015-10-12 HISTORY — DX: Major depressive disorder, single episode, unspecified: F32.9

## 2015-10-12 HISTORY — DX: Nausea with vomiting, unspecified: R11.2

## 2015-10-12 HISTORY — DX: Sleep apnea, unspecified: G47.30

## 2015-10-12 HISTORY — DX: Personal history of other diseases of the digestive system: Z87.19

## 2015-10-12 HISTORY — DX: Bronchiectasis, uncomplicated: J47.9

## 2015-10-12 LAB — CBC
HEMATOCRIT: 42.5 % (ref 36.0–46.0)
HEMOGLOBIN: 14.1 g/dL (ref 12.0–15.0)
MCH: 29.1 pg (ref 26.0–34.0)
MCHC: 33.2 g/dL (ref 30.0–36.0)
MCV: 87.8 fL (ref 78.0–100.0)
Platelets: 265 10*3/uL (ref 150–400)
RBC: 4.84 MIL/uL (ref 3.87–5.11)
RDW: 13.3 % (ref 11.5–15.5)
WBC: 9.2 10*3/uL (ref 4.0–10.5)

## 2015-10-12 LAB — SURGICAL PCR SCREEN
MRSA, PCR: NEGATIVE
Staphylococcus aureus: POSITIVE — AB

## 2015-10-12 LAB — BASIC METABOLIC PANEL
ANION GAP: 7 (ref 5–15)
BUN: 20 mg/dL (ref 6–20)
CO2: 27 mmol/L (ref 22–32)
Calcium: 9.7 mg/dL (ref 8.9–10.3)
Chloride: 107 mmol/L (ref 101–111)
Creatinine, Ser: 0.85 mg/dL (ref 0.44–1.00)
GFR calc Af Amer: >60 mL/min (ref >60)
GFR calc non Af Amer: >60 mL/min (ref >60)
Glucose, Bld: 94 mg/dL (ref 65–99)
POTASSIUM: 4.4 mmol/L (ref 3.5–5.1)
SODIUM: 141 mmol/L (ref 135–145)

## 2015-10-12 NOTE — Patient Instructions (Addendum)
[name] [name]  10/12/2015   Your procedure is scheduled on: 10-28-15 Friday  [facility]  [facility] 3[facility] floor to  Short Stay Center at   0530 AM.  Call this number if you have problems the morning of surgery [phone]   Remember: ONLY 1 PERSON MAY GO WITH YOU TO SHORT STAY TO GET  READY MORNING OF YOUR SURGERY.  Do not eat food or drink liquids :After Midnight.     Take these medicines the morning of surgery with A SIP OF WATER: Norvasc. Allopurinol. Sertraline. Omeprazole. Use Dulera as needed. Bring cpap mask/tubing. DO NOT TAKE ANY DIABETIC MEDICATIONS DAY OF YOUR SURGERY                               You may not have any metal on your body including hair pins and              piercings  Do not wear jewelry, make-up, lotions, powders or perfumes, deodorant             Do not wear nail polish.  Do not shave  48 hours prior to surgery.              Men may shave face and neck.   Do not bring valuables to the hospital. [facility] IS NOT             RESPONSIBLE   FOR VALUABLES.  Contacts, dentures or bridgework may not be worn into surgery.  Leave suitcase in the car. After surgery it may be brought to your room.     Patients discharged the day of surgery will not be allowed to drive home.  Name and phone number of your driver: [name]-spouse [phone]- [phone] cell  Special Instructions: N/A              Please read over the following fact sheets you were given: _____________________________________________________________________             [facility] - Preparing for Surgery Before surgery, you can play an important role.  Because skin is not sterile, your skin needs to be as free of germs as possible.  You can reduce the number of germs on your skin by washing with CHG (chlorahexidine gluconate) soap before surgery.  CHG is an antiseptic cleaner which kills germs and bonds with the skin to continue killing germs  even after washing. Please DO NOT use if you have an allergy to CHG or antibacterial soaps.  If your skin becomes reddened/irritated stop using the CHG and inform your nurse when you arrive at Short Stay. Do not shave (including legs and underarms) for at least 48 hours prior to the first CHG shower.  You may shave your face/neck. Please follow these instructions carefully:  1.  Shower with CHG Soap the night before surgery and the  morning of Surgery.  2.  If you choose to wash your hair, wash your hair first as usual with your  normal  shampoo.  3.  After you shampoo, rinse your hair and body thoroughly to remove the  shampoo.                           4.  Use CHG as you would any  other liquid soap.  You can apply chg directly  to the skin and wash                       Gently with a scrungie or clean washcloth.  5.  Apply the CHG Soap to your body ONLY FROM THE NECK DOWN.   Do not use on face/ open                           Wound or open sores. Avoid contact with eyes, ears mouth and genitals (private parts).                       Wash face,  Genitals (private parts) with your normal soap.             6.  Wash thoroughly, paying special attention to the area where your surgery  will be performed.  7.  Thoroughly rinse your body with warm water from the neck down.  8.  DO NOT shower/wash with your normal soap after using and rinsing off  the CHG Soap.                9.  Pat yourself dry with a clean towel.            10.  Wear clean pajamas.            11.  Place clean sheets on your bed the night of your first shower and do not  sleep with pets. Day of Surgery : Do not apply any lotions/deodorants the morning of surgery.  Please wear clean clothes to the hospital/surgery center.  FAILURE TO FOLLOW THESE INSTRUCTIONS MAY RESULT IN THE CANCELLATION OF YOUR SURGERY PATIENT SIGNATURE_________________________________  NURSE  SIGNATURE__________________________________  ________________________________________________________________________   Adam Phenix  An incentive spirometer is a tool that can help keep your lungs clear and active. This tool measures how well you are filling your lungs with each breath. Taking long deep breaths may help reverse or decrease the chance of developing breathing (pulmonary) problems (especially infection) following:  A long period of time when you are unable to move or be active. BEFORE THE PROCEDURE   If the spirometer includes an indicator to show your best effort, your nurse or respiratory therapist will set it to a desired goal.  If possible, sit up straight or lean slightly forward. Try not to slouch.  Hold the incentive spirometer in an upright position. INSTRUCTIONS FOR USE  1. Sit on the edge of your bed if possible, or sit up as far as you can in bed or on a chair. 2. Hold the incentive spirometer in an upright position. 3. Breathe out normally. 4. Place the mouthpiece in your mouth and seal your lips tightly around it. 5. Breathe in slowly and as deeply as possible, raising the piston or the ball toward the top of the column. 6. Hold your breath for 3-5 seconds or for as long as possible. Allow the piston or ball to fall to the bottom of the column. 7. Remove the mouthpiece from your mouth and breathe out normally. 8. Rest for a few seconds and repeat Steps 1 through 7 at least 10 times every 1-2 hours when you are awake. Take your time and take a few normal breaths between deep breaths. 9. The spirometer may include an indicator to show your best effort. Use the indicator as a  goal to work toward during each repetition. 10. After each set of 10 deep breaths, practice coughing to be sure your lungs are clear. If you have an incision (the cut made at the time of surgery), support your incision when coughing by placing a pillow or rolled up towels firmly  against it. Once you are able to get out of bed, walk around indoors and cough well. You may stop using the incentive spirometer when instructed by your caregiver.  RISKS AND COMPLICATIONS  Take your time so you do not get dizzy or light-headed.  If you are in pain, you may need to take or ask for pain medication before doing incentive spirometry. It is harder to take a deep breath if you are having pain. AFTER USE  Rest and breathe slowly and easily.  It can be helpful to keep track of a log of your progress. Your caregiver can provide you with a simple table to help with this. If you are using the spirometer at home, follow these instructions: [place] IF:   You are having difficultly using the spirometer.  You have trouble using the spirometer as often as instructed.  Your pain medication is not giving enough relief while using the spirometer.  You develop fever of 100.5 F (38.1 C) or higher. SEEK IMMEDIATE MEDICAL CARE IF:   You cough up bloody sputum that had not been present before.  You develop fever of 102 F (38.9 C) or greater.  You develop worsening pain at or near the incision site. MAKE SURE YOU:   Understand these instructions.  Will watch your condition.  Will get help right away if you are not doing well or get worse. Document Released: 06/04/2006 Document Revised: 04/16/2011 Document Reviewed: 08/05/2006 [facility] Patient Information 2014 [place], [place].   ________________________________________________________________________

## 2015-10-12 NOTE — Progress Notes (Signed)
10-12-15 1350 Pt. Made aware of Positive Staph aureus by PCR- Rx called to [facility] Pt aware to use as directed.

## 2015-10-12 NOTE — Pre-Procedure Instructions (Signed)
EKG done today.

## 2015-11-07 ENCOUNTER — Other Ambulatory Visit: Payer: Self-pay | Admitting: Physician Assistant

## 2015-11-16 NOTE — Patient Instructions (Addendum)
Anette GuarneriShirley A Oravec  11/16/2015   Your procedure is scheduled on: 11/25/15  [facility]  [facility] 3rd floor to  Short Stay Center at  5:30 AM.  Call this number if you have problems the morning of surgery [phone]   Remember: ONLY 1 PERSON MAY GO WITH YOU TO SHORT STAY TO GET  READY MORNING OF YOUR SURGERY.  Do not eat food or drink liquids :After Midnight.     Take these medicines the morning of surgery with A SIP OF WATER: Amlodipine, Omeprazole, Sertraline                                You may not have any metal on your body including hair pins and              piercings  Do not wear jewelry, make-up, lotions, powders or perfumes, deodorant             Do not wear nail polish.  Do not shave  48 hours prior to surgery.                Do not bring valuables to the hospital. [facility] IS NOT             RESPONSIBLE   FOR VALUABLES.  Contacts, dentures or bridgework may not be worn into surgery.  Leave suitcase in the car. After surgery it may be brought to your room.    _____________________________________________________________________             [facility] - Preparing for Surgery Before surgery, you can play an important role.  Because skin is not sterile, your skin needs to be as free of germs as possible.  You can reduce the number of germs on your skin by washing with CHG (chlorahexidine gluconate) soap before surgery.  CHG is an antiseptic cleaner which kills germs and bonds with the skin to continue killing germs even after washing. Please DO NOT use if you have an allergy to CHG or antibacterial soaps.  If your skin becomes reddened/irritated stop using the CHG and inform your nurse when you arrive at Short Stay. Do not shave (including legs and underarms) for at least 48 hours prior to the first CHG shower.  You may shave your face/neck. Please follow these instructions carefully:  1.  Shower with  CHG Soap the night before surgery and the  morning of Surgery.  2.  If you choose to wash your hair, wash your hair first as usual with your  normal  shampoo.  3.  After you shampoo, rinse your hair and body thoroughly to remove the  shampoo.                           4.  Use CHG as you would any other liquid soap.  You can apply chg directly  to the skin and wash                       Gently with a scrungie or clean washcloth.  5.  Apply the CHG Soap to your body ONLY FROM THE NECK DOWN.   Do not use on face/ open  Wound or open sores. Avoid contact with eyes, ears mouth and genitals (private parts).                       Wash face,  Genitals (private parts) with your normal soap.             6.  Wash thoroughly, paying special attention to the area where your surgery  will be performed.  7.  Thoroughly rinse your body with warm water from the neck down.  8.  DO NOT shower/wash with your normal soap after using and rinsing off  the CHG Soap.                9.  Pat yourself dry with a clean towel.            10.  Wear clean pajamas.            11.  Place clean sheets on your bed the night of your first shower and do not  sleep with pets. Day of Surgery : Do not apply any lotions/deodorants the morning of surgery.  Please wear clean clothes to the hospital/surgery center.  FAILURE TO FOLLOW THESE INSTRUCTIONS MAY RESULT IN THE CANCELLATION OF YOUR SURGERY PATIENT SIGNATURE_________________________________  NURSE SIGNATURE__________________________________  ________________________________________________________________________

## 2015-11-17 ENCOUNTER — Encounter (INDEPENDENT_AMBULATORY_CARE_PROVIDER_SITE_OTHER): Payer: Self-pay

## 2015-11-17 ENCOUNTER — Encounter (HOSPITAL_COMMUNITY): Payer: Self-pay

## 2015-11-17 ENCOUNTER — Encounter (HOSPITAL_COMMUNITY)
Admission: RE | Admit: 2015-11-17 | Discharge: 2015-11-17 | Disposition: A | Discharge status: Home / Self Care | Payer: BLUE CROSS/BLUE SHIELD | Source: Ambulatory Visit | Attending: Orthopaedic Surgery | Admitting: Orthopaedic Surgery

## 2015-11-17 DIAGNOSIS — Z01812 Encounter for preprocedural laboratory examination: Secondary | ICD-10-CM | POA: Insufficient documentation

## 2015-11-17 DIAGNOSIS — M12552 Traumatic arthropathy, left hip: Secondary | ICD-10-CM | POA: Insufficient documentation

## 2015-11-17 DIAGNOSIS — Z0183 Encounter for blood typing: Secondary | ICD-10-CM | POA: Diagnosis not present

## 2015-11-17 DIAGNOSIS — M879 Osteonecrosis, unspecified: Secondary | ICD-10-CM | POA: Diagnosis not present

## 2015-11-17 LAB — BASIC METABOLIC PANEL
ANION GAP: 7 (ref 5–15)
BUN: 18 mg/dL (ref 6–20)
CHLORIDE: 105 mmol/L (ref 101–111)
CO2: 26 mmol/L (ref 22–32)
Calcium: 9.8 mg/dL (ref 8.9–10.3)
Creatinine, Ser: 0.87 mg/dL (ref 0.44–1.00)
GFR calc non Af Amer: >60 mL/min (ref >60)
Glucose, Bld: 105 mg/dL — ABNORMAL HIGH (ref 65–99)
Potassium: 4.1 mmol/L (ref 3.5–5.1)
Sodium: 138 mmol/L (ref 135–145)

## 2015-11-17 LAB — CBC
HCT: 43.8 % (ref 36.0–46.0)
HEMOGLOBIN: 14.1 g/dL (ref 12.0–15.0)
MCH: 28.8 pg (ref 26.0–34.0)
MCHC: 32.2 g/dL (ref 30.0–36.0)
MCV: 89.6 fL (ref 78.0–100.0)
Platelets: 290 10*3/uL (ref 150–400)
RBC: 4.89 MIL/uL (ref 3.87–5.11)
RDW: 13.5 % (ref 11.5–15.5)
WBC: 8.2 10*3/uL (ref 4.0–10.5)

## 2015-11-17 LAB — SURGICAL PCR SCREEN
MRSA, PCR: NEGATIVE
Staphylococcus aureus: NEGATIVE

## 2015-11-17 LAB — ABO/RH: ABO/RH(D): A POS

## 2015-11-24 NOTE — Anesthesia Preprocedure Evaluation (Addendum)
Anesthesia Evaluation  Patient identified by MRN, date of birth, ID band Patient awake    Reviewed: Allergy & Precautions, NPO status , Patient's Chart, lab work & pertinent test results  History of Anesthesia Complications (+) PONV and history of anesthetic complications  Airway Mallampati: II  TM Distance: >3 FB Neck ROM: Full    Dental no notable dental hx. (+) Dental Advisory Given   Pulmonary sleep apnea , former smoker,    Pulmonary exam normal        Cardiovascular hypertension, Pt. on medications Normal cardiovascular exam     Neuro/Psych PSYCHIATRIC DISORDERS Depression negative neurological ROS     GI/Hepatic Neg liver ROS, hiatal hernia,   Endo/Other  Morbid obesity  Renal/GU negative Renal ROS     Musculoskeletal   Abdominal   Peds  Hematology   Anesthesia Other Findings   Reproductive/Obstetrics                            Anesthesia Physical Anesthesia Plan  ASA: III  Anesthesia Plan: MAC and Spinal   Post-op Pain Management:    Induction: Intravenous  Airway Management Planned: Natural Airway and Simple Face Mask  Additional Equipment:   Intra-op Plan:   Post-operative Plan:   Informed Consent: I have reviewed the patients History and Physical, chart, labs and discussed the procedure including the risks, benefits and alternatives for the proposed anesthesia with the patient or authorized representative who has indicated his/her understanding and acceptance.   Dental advisory given  Plan Discussed with: CRNA and Anesthesiologist  Anesthesia Plan Comments:        Anesthesia Quick Evaluation

## 2015-11-25 ENCOUNTER — Inpatient Hospital Stay (HOSPITAL_COMMUNITY): Payer: BLUE CROSS/BLUE SHIELD | Admitting: Anesthesiology

## 2015-11-25 ENCOUNTER — Inpatient Hospital Stay (HOSPITAL_COMMUNITY)
Admission: RE | Admit: 2015-11-25 | Discharge: 2015-11-27 | DRG: 470 | Disposition: A | Discharge status: Home Health | Payer: BLUE CROSS/BLUE SHIELD | Source: Ambulatory Visit | Attending: Orthopaedic Surgery | Admitting: Orthopaedic Surgery

## 2015-11-25 ENCOUNTER — Inpatient Hospital Stay (HOSPITAL_COMMUNITY): Payer: BLUE CROSS/BLUE SHIELD

## 2015-11-25 ENCOUNTER — Encounter (HOSPITAL_COMMUNITY): Payer: Self-pay | Admitting: *Deleted

## 2015-11-25 ENCOUNTER — Encounter (HOSPITAL_COMMUNITY)
Admission: RE | Disposition: A | Discharge status: Home Health | Payer: Self-pay | Source: Ambulatory Visit | Attending: Orthopaedic Surgery

## 2015-11-25 DIAGNOSIS — Z6838 Body mass index (BMI) 38.0-38.9, adult: Secondary | ICD-10-CM

## 2015-11-25 DIAGNOSIS — Z87891 Personal history of nicotine dependence: Secondary | ICD-10-CM | POA: Diagnosis not present

## 2015-11-25 DIAGNOSIS — M109 Gout, unspecified: Secondary | ICD-10-CM | POA: Diagnosis present

## 2015-11-25 DIAGNOSIS — M12552 Traumatic arthropathy, left hip: Secondary | ICD-10-CM

## 2015-11-25 DIAGNOSIS — Z472 Encounter for removal of internal fixation device: Secondary | ICD-10-CM

## 2015-11-25 DIAGNOSIS — R05 Cough: Secondary | ICD-10-CM | POA: Diagnosis present

## 2015-11-25 DIAGNOSIS — K449 Diaphragmatic hernia without obstruction or gangrene: Secondary | ICD-10-CM | POA: Diagnosis present

## 2015-11-25 DIAGNOSIS — R0982 Postnasal drip: Secondary | ICD-10-CM | POA: Diagnosis present

## 2015-11-25 DIAGNOSIS — F329 Major depressive disorder, single episode, unspecified: Secondary | ICD-10-CM | POA: Diagnosis present

## 2015-11-25 DIAGNOSIS — M1612 Unilateral primary osteoarthritis, left hip: Secondary | ICD-10-CM

## 2015-11-25 DIAGNOSIS — Z882 Allergy status to sulfonamides status: Secondary | ICD-10-CM

## 2015-11-25 DIAGNOSIS — M247 Protrusio acetabuli: Secondary | ICD-10-CM | POA: Diagnosis present

## 2015-11-25 DIAGNOSIS — Z79891 Long term (current) use of opiate analgesic: Secondary | ICD-10-CM

## 2015-11-25 DIAGNOSIS — M87252 Osteonecrosis due to previous trauma, left femur: Secondary | ICD-10-CM | POA: Diagnosis present

## 2015-11-25 DIAGNOSIS — M619 Calcification and ossification of muscle, unspecified: Secondary | ICD-10-CM | POA: Diagnosis present

## 2015-11-25 DIAGNOSIS — I10 Essential (primary) hypertension: Secondary | ICD-10-CM | POA: Diagnosis present

## 2015-11-25 DIAGNOSIS — Z96641 Presence of right artificial hip joint: Secondary | ICD-10-CM

## 2015-11-25 DIAGNOSIS — G4730 Sleep apnea, unspecified: Secondary | ICD-10-CM | POA: Diagnosis present

## 2015-11-25 DIAGNOSIS — Z96642 Presence of left artificial hip joint: Secondary | ICD-10-CM

## 2015-11-25 DIAGNOSIS — Z419 Encounter for procedure for purposes other than remedying health state, unspecified: Secondary | ICD-10-CM

## 2015-11-25 HISTORY — PX: HARDWARE REMOVAL: SHX979

## 2015-11-25 HISTORY — PX: TOTAL HIP ARTHROPLASTY: SHX124

## 2015-11-25 LAB — TYPE AND SCREEN
ABO/RH(D): A POS
Antibody Screen: NEGATIVE

## 2015-11-25 SURGERY — ARTHROPLASTY, HIP, TOTAL, ANTERIOR APPROACH
Anesthesia: Monitor Anesthesia Care | Site: Hip | Laterality: Left

## 2015-11-25 MED ORDER — SUGAMMADEX SODIUM 200 MG/2ML IV SOLN
INTRAVENOUS | Status: AC
  Filled 2015-11-25: qty 2

## 2015-11-25 MED ORDER — PROMETHAZINE HCL 25 MG/ML IJ SOLN
INTRAMUSCULAR | Status: AC
  Filled 2015-11-25: qty 1

## 2015-11-25 MED ORDER — FENTANYL CITRATE (PF) 100 MCG/2ML IJ SOLN
INTRAMUSCULAR | Status: DC | PRN
  Administered 2015-11-25: 50 ug via INTRAVENOUS
  Administered 2015-11-25: 25 ug via INTRAVENOUS
  Administered 2015-11-25: 50 ug via INTRAVENOUS
  Administered 2015-11-25: 25 ug via INTRAVENOUS
  Administered 2015-11-25: 50 ug via INTRAVENOUS

## 2015-11-25 MED ORDER — PROPOFOL 10 MG/ML IV BOLUS
INTRAVENOUS | Status: AC
  Filled 2015-11-25: qty 40

## 2015-11-25 MED ORDER — ROCURONIUM BROMIDE 50 MG/5ML IV SOSY
PREFILLED_SYRINGE | INTRAVENOUS | Status: AC
  Filled 2015-11-25: qty 5

## 2015-11-25 MED ORDER — TRANEXAMIC ACID 1000 MG/10ML IV SOLN
1000.0000 mg | INTRAVENOUS | Status: AC
  Administered 2015-11-25: 1000 mg via INTRAVENOUS
  Filled 2015-11-25: qty 1100

## 2015-11-25 MED ORDER — FENTANYL CITRATE (PF) 100 MCG/2ML IJ SOLN
INTRAMUSCULAR | Status: AC
  Filled 2015-11-25: qty 2

## 2015-11-25 MED ORDER — ASPIRIN 81 MG PO CHEW
81.0000 mg | CHEWABLE_TABLET | Freq: Two times a day (BID) | ORAL | Status: DC
  Administered 2015-11-25 – 2015-11-26 (×2): 81 mg via ORAL
  Filled 2015-11-25 (×2): qty 1

## 2015-11-25 MED ORDER — LACTATED RINGERS IV SOLN: INTRAVENOUS | Status: DC

## 2015-11-25 MED ORDER — CEFAZOLIN IN D5W 1 GM/50ML IV SOLN
1.0000 g | Freq: Four times a day (QID) | INTRAVENOUS | Status: AC
  Administered 2015-11-25 (×2): 1 g via INTRAVENOUS
  Filled 2015-11-25 (×2): qty 50

## 2015-11-25 MED ORDER — PROPOFOL 500 MG/50ML IV EMUL
INTRAVENOUS | Status: DC | PRN
  Administered 2015-11-25: 40 ug/kg/min via INTRAVENOUS

## 2015-11-25 MED ORDER — ROCURONIUM BROMIDE 50 MG/5ML IV SOSY
PREFILLED_SYRINGE | INTRAVENOUS | Status: DC | PRN
Start: 2015-11-25 — End: 2015-11-25
  Administered 2015-11-25: 40 mg via INTRAVENOUS
  Administered 2015-11-25: 10 mg via INTRAVENOUS

## 2015-11-25 MED ORDER — DEXAMETHASONE SODIUM PHOSPHATE 10 MG/ML IJ SOLN
INTRAMUSCULAR | Status: AC
  Filled 2015-11-25: qty 1

## 2015-11-25 MED ORDER — PHENOL 1.4 % MT LIQD: 1.0000 | OROMUCOSAL | Status: DC | PRN

## 2015-11-25 MED ORDER — CEFAZOLIN SODIUM-DEXTROSE 2-4 GM/100ML-% IV SOLN
2.0000 g | INTRAVENOUS | Status: AC
  Administered 2015-11-25: 2 g via INTRAVENOUS

## 2015-11-25 MED ORDER — DOCUSATE SODIUM 100 MG PO CAPS
100.0000 mg | ORAL_CAPSULE | Freq: Two times a day (BID) | ORAL | Status: DC
  Administered 2015-11-25 – 2015-11-26 (×3): 100 mg via ORAL
  Filled 2015-11-25 (×3): qty 1

## 2015-11-25 MED ORDER — SERTRALINE HCL 50 MG PO TABS
100.0000 mg | ORAL_TABLET | Freq: Every day | ORAL | Status: DC
  Administered 2015-11-26: 100 mg via ORAL
  Filled 2015-11-25: qty 2

## 2015-11-25 MED ORDER — PANTOPRAZOLE SODIUM 40 MG PO TBEC
40.0000 mg | DELAYED_RELEASE_TABLET | Freq: Every day | ORAL | Status: DC
  Administered 2015-11-26: 40 mg via ORAL
  Filled 2015-11-25: qty 1

## 2015-11-25 MED ORDER — MOMETASONE FURO-FORMOTEROL FUM 100-5 MCG/ACT IN AERO
2.0000 | INHALATION_SPRAY | Freq: Two times a day (BID) | RESPIRATORY_TRACT | Status: DC
  Administered 2015-11-25 – 2015-11-27 (×4): 2 via RESPIRATORY_TRACT
  Filled 2015-11-25: qty 8.8

## 2015-11-25 MED ORDER — CEFAZOLIN SODIUM-DEXTROSE 2-4 GM/100ML-% IV SOLN
INTRAVENOUS | Status: AC
  Filled 2015-11-25: qty 100

## 2015-11-25 MED ORDER — LIDOCAINE 2% (20 MG/ML) 5 ML SYRINGE
INTRAMUSCULAR | Status: DC | PRN
  Administered 2015-11-25: 100 mg via INTRAVENOUS

## 2015-11-25 MED ORDER — HYDROMORPHONE HCL 1 MG/ML IJ SOLN: 1.0000 mg | INTRAMUSCULAR | Status: DC | PRN

## 2015-11-25 MED ORDER — PROMETHAZINE HCL 25 MG/ML IJ SOLN
6.2500 mg | INTRAMUSCULAR | Status: DC | PRN
  Administered 2015-11-25: 12.5 mg via INTRAVENOUS

## 2015-11-25 MED ORDER — DEXAMETHASONE SODIUM PHOSPHATE 4 MG/ML IJ SOLN
INTRAMUSCULAR | Status: DC | PRN
  Administered 2015-11-25: 10 mg via INTRAVENOUS

## 2015-11-25 MED ORDER — LACTATED RINGERS IV SOLN
INTRAVENOUS | Status: DC | PRN
  Administered 2015-11-25: 07:00:00 via INTRAVENOUS

## 2015-11-25 MED ORDER — ONDANSETRON HCL 4 MG PO TABS: 4.0000 mg | ORAL_TABLET | Freq: Four times a day (QID) | ORAL | Status: DC | PRN

## 2015-11-25 MED ORDER — SUCCINYLCHOLINE CHLORIDE 200 MG/10ML IV SOSY
PREFILLED_SYRINGE | INTRAVENOUS | Status: DC | PRN
  Administered 2015-11-25: 120 mg via INTRAVENOUS

## 2015-11-25 MED ORDER — MIDAZOLAM HCL 2 MG/2ML IJ SOLN
INTRAMUSCULAR | Status: AC
  Filled 2015-11-25: qty 2

## 2015-11-25 MED ORDER — SUGAMMADEX SODIUM 200 MG/2ML IV SOLN
INTRAVENOUS | Status: DC | PRN
  Administered 2015-11-25: 200 mg via INTRAVENOUS

## 2015-11-25 MED ORDER — ONDANSETRON HCL 4 MG/2ML IJ SOLN: 4.0000 mg | Freq: Four times a day (QID) | INTRAMUSCULAR | Status: DC | PRN

## 2015-11-25 MED ORDER — HYDROMORPHONE HCL 2 MG/ML IJ SOLN
INTRAMUSCULAR | Status: AC
  Filled 2015-11-25: qty 1

## 2015-11-25 MED ORDER — 0.9 % SODIUM CHLORIDE (POUR BTL) OPTIME
TOPICAL | Status: DC | PRN
  Administered 2015-11-25: 1000 mL

## 2015-11-25 MED ORDER — ZOLPIDEM TARTRATE 5 MG PO TABS: 5.0000 mg | ORAL_TABLET | Freq: Every evening | ORAL | Status: DC | PRN

## 2015-11-25 MED ORDER — ONDANSETRON HCL 4 MG/2ML IJ SOLN
INTRAMUSCULAR | Status: DC | PRN
  Administered 2015-11-25: 4 mg via INTRAVENOUS

## 2015-11-25 MED ORDER — SODIUM CHLORIDE 0.9 % IR SOLN
Status: DC | PRN
  Administered 2015-11-25: 1000 mL

## 2015-11-25 MED ORDER — METOCLOPRAMIDE HCL 5 MG PO TABS: 5.0000 mg | ORAL_TABLET | Freq: Three times a day (TID) | ORAL | Status: DC | PRN

## 2015-11-25 MED ORDER — METHOCARBAMOL 1000 MG/10ML IJ SOLN
500.0000 mg | Freq: Four times a day (QID) | INTRAVENOUS | Status: DC | PRN
  Administered 2015-11-25: 500 mg via INTRAVENOUS
  Filled 2015-11-25: qty 550
  Filled 2015-11-25: qty 5

## 2015-11-25 MED ORDER — ACETAMINOPHEN 325 MG PO TABS
650.0000 mg | ORAL_TABLET | Freq: Four times a day (QID) | ORAL | Status: DC | PRN
  Administered 2015-11-27: 650 mg via ORAL
  Filled 2015-11-25: qty 2

## 2015-11-25 MED ORDER — SUCCINYLCHOLINE CHLORIDE 20 MG/ML IJ SOLN
INTRAMUSCULAR | Status: AC
  Filled 2015-11-25: qty 1

## 2015-11-25 MED ORDER — OXYCODONE HCL 5 MG PO TABS
5.0000 mg | ORAL_TABLET | ORAL | Status: DC | PRN
  Administered 2015-11-25 (×2): 10 mg via ORAL
  Administered 2015-11-25 (×2): 5 mg via ORAL
  Administered 2015-11-26 – 2015-11-27 (×6): 10 mg via ORAL
  Filled 2015-11-25 (×4): qty 2
  Filled 2015-11-25 (×2): qty 1
  Filled 2015-11-25 (×4): qty 2

## 2015-11-25 MED ORDER — STERILE WATER FOR IRRIGATION IR SOLN
Status: DC | PRN
  Administered 2015-11-25: 3000 mL

## 2015-11-25 MED ORDER — MIDAZOLAM HCL 5 MG/5ML IJ SOLN
INTRAMUSCULAR | Status: DC | PRN
  Administered 2015-11-25: 2 mg via INTRAVENOUS

## 2015-11-25 MED ORDER — HYDROMORPHONE HCL 1 MG/ML IJ SOLN
INTRAMUSCULAR | Status: DC | PRN
  Administered 2015-11-25 (×4): 0.5 mg via INTRAVENOUS

## 2015-11-25 MED ORDER — SODIUM CHLORIDE 0.9 % IV SOLN
INTRAVENOUS | Status: DC
  Administered 2015-11-25: 75 mL/h via INTRAVENOUS

## 2015-11-25 MED ORDER — METOCLOPRAMIDE HCL 5 MG/ML IJ SOLN: 5.0000 mg | Freq: Three times a day (TID) | INTRAMUSCULAR | Status: DC | PRN

## 2015-11-25 MED ORDER — ALUM & MAG HYDROXIDE-SIMETH 200-200-20 MG/5ML PO SUSP: 30.0000 mL | ORAL | Status: DC | PRN

## 2015-11-25 MED ORDER — AMLODIPINE BESYLATE 5 MG PO TABS
5.0000 mg | ORAL_TABLET | Freq: Every day | ORAL | Status: DC
  Administered 2015-11-26: 5 mg via ORAL
  Filled 2015-11-25: qty 1

## 2015-11-25 MED ORDER — MENTHOL 3 MG MT LOZG: 1.0000 | LOZENGE | OROMUCOSAL | Status: DC | PRN

## 2015-11-25 MED ORDER — ACETAMINOPHEN 650 MG RE SUPP
650.0000 mg | Freq: Four times a day (QID) | RECTAL | Status: DC | PRN
Start: 2015-11-25 — End: 2015-11-27

## 2015-11-25 MED ORDER — DIPHENHYDRAMINE HCL 12.5 MG/5ML PO ELIX: 12.5000 mg | ORAL_SOLUTION | ORAL | Status: DC | PRN

## 2015-11-25 MED ORDER — PROPOFOL 10 MG/ML IV BOLUS
INTRAVENOUS | Status: DC | PRN
  Administered 2015-11-25: 200 mg via INTRAVENOUS

## 2015-11-25 MED ORDER — HYDROMORPHONE HCL 1 MG/ML IJ SOLN
INTRAMUSCULAR | Status: AC
  Filled 2015-11-25: qty 1

## 2015-11-25 MED ORDER — ALLOPURINOL 100 MG PO TABS
100.0000 mg | ORAL_TABLET | Freq: Every day | ORAL | Status: DC
  Administered 2015-11-26: 100 mg via ORAL
  Filled 2015-11-25 (×2): qty 1

## 2015-11-25 MED ORDER — LIDOCAINE 2% (20 MG/ML) 5 ML SYRINGE
INTRAMUSCULAR | Status: AC
  Filled 2015-11-25: qty 5

## 2015-11-25 MED ORDER — METHOCARBAMOL 500 MG PO TABS: 500.0000 mg | ORAL_TABLET | Freq: Four times a day (QID) | ORAL | Status: DC | PRN

## 2015-11-25 MED ORDER — ADULT MULTIVITAMIN W/MINERALS CH
1.0000 | ORAL_TABLET | Freq: Every day | ORAL | Status: DC
  Administered 2015-11-26: 1 via ORAL
  Filled 2015-11-25: qty 1

## 2015-11-25 MED ORDER — ONDANSETRON HCL 4 MG/2ML IJ SOLN
INTRAMUSCULAR | Status: AC
  Filled 2015-11-25: qty 2

## 2015-11-25 MED ORDER — HYDROMORPHONE HCL 1 MG/ML IJ SOLN
0.2500 mg | INTRAMUSCULAR | Status: DC | PRN
  Administered 2015-11-25: 0.25 mg via INTRAVENOUS

## 2015-11-25 SURGICAL SUPPLY — 57 items
APL SKNCLS STERI-STRIP NONHPOA (GAUZE/BANDAGES/DRESSINGS) ×1
BAG SPEC THK2 15X12 ZIP CLS (MISCELLANEOUS) ×1
BAG ZIPLOCK 12X15 (MISCELLANEOUS) ×3 IMPLANT
BANDAGE ACE 6X5 VEL STRL LF (GAUZE/BANDAGES/DRESSINGS) ×3 IMPLANT
BANDAGE ESMARK 6X9 LF (GAUZE/BANDAGES/DRESSINGS) ×1 IMPLANT
BENZOIN TINCTURE PRP APPL 2/3 (GAUZE/BANDAGES/DRESSINGS) ×2 IMPLANT
BLADE SAW SGTL 18X1.27X75 (BLADE) ×2 IMPLANT
BLADE SAW SGTL 18X1.27X75MM (BLADE) ×1
BNDG CMPR 9X6 STRL LF SNTH (GAUZE/BANDAGES/DRESSINGS) ×1
BNDG ESMARK 6X9 LF (GAUZE/BANDAGES/DRESSINGS) ×3
CAPT HIP TOTAL 2 ×2 IMPLANT
CELLS DAT CNTRL 66122 CELL SVR (MISCELLANEOUS) ×1 IMPLANT
CLOSURE WOUND 1/2 X4 (GAUZE/BANDAGES/DRESSINGS) ×1
CLOTH BEACON ORANGE TIMEOUT ST (SAFETY) ×3 IMPLANT
CUFF TOURN SGL QUICK 34 (TOURNIQUET CUFF) ×3
CUFF TRNQT CYL 34X4X40X1 (TOURNIQUET CUFF) ×1 IMPLANT
DRAPE POUCH INSTRU U-SHP 10X18 (DRAPES) ×3 IMPLANT
DRAPE STERI IOBAN 125X83 (DRAPES) ×3 IMPLANT
DRAPE U-SHAPE 47X51 STRL (DRAPES) ×6 IMPLANT
DRSG AQUACEL AG ADV 3.5X10 (GAUZE/BANDAGES/DRESSINGS) ×3 IMPLANT
DRSG PAD ABDOMINAL 8X10 ST (GAUZE/BANDAGES/DRESSINGS) ×3 IMPLANT
DURAPREP 26ML APPLICATOR (WOUND CARE) ×3 IMPLANT
ELECT REM PT RETURN 9FT ADLT (ELECTROSURGICAL) ×3
ELECTRODE REM PT RTRN 9FT ADLT (ELECTROSURGICAL) ×1 IMPLANT
GAUZE SPONGE 4X4 12PLY STRL (GAUZE/BANDAGES/DRESSINGS) ×1 IMPLANT
GAUZE XEROFORM 1X8 LF (GAUZE/BANDAGES/DRESSINGS) ×3 IMPLANT
GLOVE BIO SURGEON STRL SZ7.5 (GLOVE) ×5 IMPLANT
GLOVE BIOGEL PI IND STRL 8 (GLOVE) ×2 IMPLANT
GLOVE BIOGEL PI INDICATOR 8 (GLOVE) ×16
GLOVE ECLIPSE 8.0 STRL XLNG CF (GLOVE) ×3 IMPLANT
GOWN STRL REUS W/TWL XL LVL3 (GOWN DISPOSABLE) ×12 IMPLANT
HANDPIECE INTERPULSE COAX TIP (DISPOSABLE) ×3
HOLDER FOLEY CATH W/STRAP (MISCELLANEOUS) ×3 IMPLANT
KIT BASIN OR (CUSTOM PROCEDURE TRAY) ×3 IMPLANT
NS IRRIG 1000ML POUR BTL (IV SOLUTION) ×3 IMPLANT
PACK ANTERIOR HIP CUSTOM (KITS) ×3 IMPLANT
PACK TOTAL JOINT (CUSTOM PROCEDURE TRAY) ×3 IMPLANT
PADDING CAST COTTON 6X4 STRL (CAST SUPPLIES) ×3 IMPLANT
POSITIONER SURGICAL ARM (MISCELLANEOUS) ×3 IMPLANT
RTRCTR WOUND ALEXIS 18CM MED (MISCELLANEOUS) ×3
SET HNDPC FAN SPRY TIP SCT (DISPOSABLE) ×1 IMPLANT
STAPLER VISISTAT 35W (STAPLE) IMPLANT
STRIP CLOSURE SKIN 1/2X4 (GAUZE/BANDAGES/DRESSINGS) ×2 IMPLANT
SUT ETHIBOND NAB CT1 #1 30IN (SUTURE) ×3 IMPLANT
SUT ETHILON 2 0 PS N (SUTURE) ×3 IMPLANT
SUT MNCRL AB 4-0 PS2 18 (SUTURE) IMPLANT
SUT VIC AB 0 CT1 36 (SUTURE) ×3 IMPLANT
SUT VIC AB 1 CT1 27 (SUTURE) ×3
SUT VIC AB 1 CT1 27XBRD ANTBC (SUTURE) ×1 IMPLANT
SUT VIC AB 1 CT1 36 (SUTURE) ×3 IMPLANT
SUT VIC AB 2-0 CT1 27 (SUTURE) ×6
SUT VIC AB 2-0 CT1 TAPERPNT 27 (SUTURE) ×2 IMPLANT
TOWEL OR 17X26 10 PK STRL BLUE (TOWEL DISPOSABLE) ×6 IMPLANT
TRAY FOLEY CATH 14FR (SET/KITS/TRAYS/PACK) ×2 IMPLANT
TRAY FOLEY W/METER SILVER 16FR (SET/KITS/TRAYS/PACK) ×1 IMPLANT
WATER STERILE IRR 1500ML POUR (IV SOLUTION) ×6 IMPLANT
YANKAUER SUCT BULB TIP NO VENT (SUCTIONS) ×3 IMPLANT

## 2015-11-25 NOTE — Evaluation (Signed)
Physical Therapy Evaluation Patient Details Name: [name] [name] MRN: [id number] DOB: 1960-06-12 Today's Date: 11/25/2015   History of Present Illness  Pt s/p L THR and with hx of R hemi hip from posterior approach and previous L hip pinning  Clinical Impression  Pt s/p L THR presents with decreased L LE strength/ROM and post op pain limiting functional mobility.  Pt should progress to dc home with family assist and HHPT follow up.      Follow Up Recommendations Home health PT    Equipment Recommendations  None recommended by PT    Recommendations for Other Services OT consult     Precautions / Restrictions Precautions Precautions: Fall Restrictions Weight Bearing Restrictions: No Other Position/Activity Restrictions: WBAT      Mobility  Bed Mobility Overal bed mobility: Needs Assistance;+2 for physical assistance;+ 2 for safety/equipment Bed Mobility: Supine to Sit;Sit to Supine     Supine to sit: Min assist;+2 for physical assistance;+2 for safety/equipment Sit to supine: Min assist;Mod assist;+2 for physical assistance;+2 for safety/equipment   General bed mobility comments: cues for seqeunce and use of R LE to self assist.  Physical assist to manage L LE and control trunk  Transfers Overall transfer level: Needs assistance Equipment used: Rolling walker (2 wheeled) Transfers: Sit to/from Stand Sit to Stand: Min assist;Mod assist;+2 physical assistance;+2 safety/equipment;From elevated surface         General transfer comment: cues for LE management and use of UEs to self assist  Ambulation/Gait Ambulation/Gait assistance: Min assist;+2 physical assistance;+2 safety/equipment Ambulation Distance (Feet): 12 Feet Assistive device: Rolling walker (2 wheeled) Gait Pattern/deviations: Step-to pattern;Decreased step length - right;Decreased step length - left;Shuffle;Trunk flexed Gait velocity: decr Gait velocity interpretation: Below normal speed for  age/gender General Gait Details: cues for sequence, posture and position from [employer]  Stairs            Wheelchair Mobility    Modified Rankin (Stroke Patients Only)       Balance                                             Pertinent Vitals/Pain Pain Assessment: 0-10 Pain Score: 6  Pain Location: L hip Pain Descriptors / Indicators: Aching;Sore Pain Intervention(s): Limited activity within patient's tolerance;Monitored during session;Premedicated before session;Ice applied    Home Living Family/patient expects to be discharged to:: Private residence Living Arrangements: Spouse/significant other Available Help at Discharge: Family Type of Home: House Home Access: Stairs to enter Entrance Stairs-Rails: Right;Left;Can reach both Secretary/administratorntrance Stairs-Number of Steps: 3 Home Layout: Two level Home Equipment: Scientist, forensicWalker - standard;Walker - 4 wheels      Prior Function Level of Independence: Independent with assistive device(s)         Comments: used cane      Hand Dominance        Extremity/Trunk Assessment   Upper Extremity Assessment: Overall WFL for tasks assessed           Lower Extremity Assessment: LLE deficits/detail         Communication   Communication: No difficulties  Cognition Arousal/Alertness: Awake/alert Behavior During Therapy: WFL for tasks assessed/performed Overall Cognitive Status: Within Functional Limits for tasks assessed                      General Comments      Exercises Total Joint Exercises  Ankle Circles/Pumps: AROM;Both;15 reps;Supine   Assessment/Plan    PT Assessment Patient needs continued PT services  PT Problem List Decreased strength;Decreased range of motion;Decreased activity tolerance;Decreased mobility;Pain;Decreased knowledge of use of DME;Obesity          PT Treatment Interventions DME instruction;Gait training;Stair training;Functional mobility training;Therapeutic  activities;Therapeutic exercise;Patient/family education    PT Goals (Current goals can be found in the Care Plan section)  Acute Rehab PT Goals Patient Stated Goal: Regain IND  PT Goal Formulation: With patient Time For Goal Achievement: 12/01/15 Potential to Achieve Goals: Good    Frequency 7X/week   Barriers to discharge        Co-evaluation               End of Session Equipment Utilized During Treatment: Gait belt Activity Tolerance: Patient limited by fatigue;Patient limited by pain Patient left: in bed;with call bell/phone within reach;with bed alarm set;with family/visitor present Nurse Communication: Mobility status         Time: 1633-1700 PT Time Calculation (min) (ACUTE ONLY): 27 min   Charges:   PT Evaluation $PT Eval Low Complexity: 1 Procedure PT Treatments $Gait Training: 8-22 mins   PT G Codes:        Adlee Paar Nov 27, 2015, 5:12 PM

## 2015-11-25 NOTE — Op Note (Signed)
[name] Bon:  [name], [name] [name]          ACCOUNT NO.:  [id number]  MEDICAL RECORD NO.:  [id number]  LOCATION:  [facility]                         [facility]  PHYSICIAN:  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  PROCEDURE: 1. Removal of retained cannulated screws, left hip. 2. Left total hip arthroplasty through direct anterior approach.  IMPLANTS:  DePuy Sector Gription acetabular component size 50, size 32 +0 neutral polyethylene liner, size 10 Corail femoral component with varus offset (KLA), size 32 +5 ceramic hip ball.  SURGEON:  [name], M.D.  ASSISTANT:  Richardean CanalGilbert Clark, PA-C.  ANESTHESIA: 1. Attempted spinal. 2. General.  ANTIBIOTICS:  2 g of IV Ancef.  BLOOD LOSS:  450 mL.  COMPLICATIONS:  None.  INDICATIONS:  [name] [name] is a 55 year old female well known to me. In 2012, she sustained nondisplaced to minimally displaced femoral neck fracture and a mechanical fall.  We treated this with cannulated screws, and she actually did well for a while until this last year, she started to develop pain in her groin, limb to her step and pain with rotation of her hip.  X-rays did confirm developing some osteonecrosis and osteoarthritis post traumatically of her left hip.  At this point due to the pain she is experiencing, her decreased mobility, her increased pain, her decreased quality of life; she does wish to proceed with hardware removal and a total hip arthroplasty.  I explained in detail what this surgery involves including a detailed discussion of the risks of acute  blood loss anemia, nerve or vessel injury, fracture, infection, dislocation, DVT, with our goals being decreased pain, improved mobility, and overall improved quality of life.  PROCEDURE DESCRIPTION:  After informed consent was obtained, appropriate left hip was marked.  She was brought to the operating room, and while she was on her stretcher, they attempted spinal anesthesia but were not successful, she was then laid in a supine position on a stretcher, general anesthesia was obtained and a Foley catheter was placed, and then both feet had traction boots applied to them.  Next, she was placed supine on the Hana fracture table, the perineal post in place and both legs in inline skeletal traction devices, but no traction applied.  Her left operative hip was then prepped and draped with DuraPrep and sterile drapes.  Time-out was called.  She was identified as correct patient and correct left hip.  I then made an incision over her previous incision for cannulated screws, and dissected down the screws.  We removed all 3 screws without difficulty.  We then irrigated this wound with normal saline solution, closed the deep tissue with 0 Vicryl followed by 2-0 Vicryl in the subcutaneous tissue and 4-0 Monocryl subcuticular stitch. We then proceeded with an anterior hip surgery for hip replacement.  We then made an incision just inferior and posterior to the anterior  superior iliac spine and carried this obliquely down the leg.  We dissected down the tensor fascia lata muscle, and the tensor fascia was then divided longitudinally to proceed with a direct anterior approach to the hip.  We identified and cauterized the circumflex vessels and then identified the hip capsule.  I opened up the hip capsule exposing the femoral neck and head and finding a significant osteoarthritis.  We placed Cobra retractors around the medial and lateral femoral neck and made our femoral neck cut with an oscillating  saw proximal to the lesser trochanter and completed this with an osteotome and placed a corkscrew guide in the femoral head and removed the femoral head in its entirety and found it to be significantly diseased.  We then cleaned the acetabular remnants of the acetabular labrum.  I placed a bent Hohmann over the medial acetabular rim and then cleaned the remnants of the acetabular labrum and other debris.  We then began reaming under direct visualization from a size 43 reamer up to a size 50, with all reamers under direct visualization and the last reamer under direct fluoroscopy, so we could obtain our depth of reaming, our inclination, and anteversion.  We then placed the real DePuy Sector Gription acetabular component size 50 and 32 +0 neutral polyethylene liner for that size 50 acetabular component.  Attention was then turned to the femur.  With the leg externally rotated to 120 degrees, extended and adducted, we were able to place a Mueller retractor medially and Hohmann retractor behind the greater trochanter.  We released the lateral joint capsule and used a box cutting osteotome to enter the femoral canal and a rongeur to lateralize.  We then began broaching from a size 8 broach using the Corail broaching system up to a size 10 and with a size 10 in place, we trialed a varus offset femoral neck and 36 +5 hip ball based on what we felt like we need to get with the leg lengths and offset as well.  We brought the leg back over and up with traction and internal rotation reducing the pelvis.  We were pleased with range of motion, offset, leg length, and stability.  We then dislocated the hip and removed the trial components.  We were able to place the real DePuy Corail femoral component size 10 with varus offset and the real 32 +5 ceramic hip ball and reduced this back into the pelvis and it was stable.  We then irrigated the soft tissue with normal saline solution using  pulsatile lavage.  There was not a joint capsule to close, so we closed the tensor fascia with #1 Vicryl followed by 0 Vicryl in the deep tissue, 2-0 Vicryl subcutaneous tissue, 4-0 Monocryl subcuticular stitch, and Steri- Strips on the skin.  Aquacel dressing was applied.  She was taken off the Hana table, awakened, extubated and taken to the recovery room in stable condition.  All final counts were correct.  There were no complications noted.  Of note, Richardean Canal, PA-C assisted in the entire case.  His assistance was crucial for facilitating all aspects of this case.     Vanita Panda. Magnus Ivan, M.D.     CYB/MEDQ  D:  11/25/2015  T:  11/25/2015  Job:  [id number]

## 2015-11-25 NOTE — Anesthesia Procedure Notes (Signed)
Procedure Name: Intubation Date/Time: 11/25/2015 7:51 AM Performed by: Jarvis NewcomerARMISTEAD, Emalia Witkop A Pre-anesthesia Checklist: Patient identified, Timeout performed, Emergency Drugs available, Suction available and Patient being monitored Patient Re-evaluated:Patient Re-evaluated prior to inductionOxygen Delivery Method: Circle system utilized Preoxygenation: Pre-oxygenation with 100% oxygen Intubation Type: IV induction Ventilation: Mask ventilation without difficulty Laryngoscope Size: Mac and 4 Grade View: Grade I Tube type: Glide rite Tube size: 7.5 mm Number of attempts: 3 Airway Equipment and Method: Video-laryngoscopy Placement Confirmation: ETT inserted through vocal cords under direct vision,  positive ETCO2 and breath sounds checked- equal and bilateral Secured at: 21 cm Tube secured with: Tape Dental Injury: Teeth and Oropharynx as per pre-operative assessment  Difficulty Due To: Difficult Airway- due to limited oral opening, Difficult Airway- due to anterior larynx and Difficulty was anticipated Comments: Smooth IV induction. Easy mask, DL X 1 by CRNA with MAC 4. Grade 3 view. Pt noted to have a small mouth opening and an anterior larynx. Easy mask. DL X1 by Dr. Krista BlueSinger. Grade 3 view. Glidescope present. DL X1 by CRNA with MAC 4 Glidescope blade. Grade 1 view. ATOI. ETT secured.

## 2015-11-25 NOTE — H&P (Signed)
TOTAL HIP ADMISSION H&P  Patient is admitted for left total hip arthroplasty.  Subjective:  Chief Complaint: left hip pain  HPI: [name] [name], 55 y.o. female, has a history of pain and functional disability in the left hip(s) due to trauma and arthritis and patient has failed non-surgical conservative treatments for greater than 12 weeks to include NSAID's and/or analgesics, flexibility and strengthening excercises, weight reduction as appropriate and activity modification.  Onset of symptoms was abrupt starting 1 years ago with gradually worsening course since that time.The patient noted prior procedures of the hip to include cannulated screws to fix a left femoral neck fracture on the left hip(s).  Patient currently rates pain in the left hip at 10 out of 10 with activity. Patient has night pain, worsening of pain with activity and weight bearing, pain that interfers with activities of daily living and pain with passive range of motion. Patient has evidence of joint space narrowing by imaging studies. This condition presents safety issues increasing the risk of falls.  There is no current active infection.  Patient Active Problem List   Diagnosis Date Noted  . Traumatic arthritis of left hip 11/25/2015  . OBESITY 11/04/2006  . POSTNASAL DRIP SYNDROME 11/04/2006  . DISC DISEASE, LUMBAR 11/04/2006  . DYSPNEA 11/04/2006  . COUGH, CHRONIC 11/04/2006  . HYSTERECTOMY, HX OF 11/04/2006  . CHOLECYSTECTOMY, HX OF 11/04/2006   Past Medical History:  Diagnosis Date  . Aneurysm (HCC)    left hand"caused blood left thumb" surgery by Dr. Merlyn Lot "repair of aneurysm"- all resolved with surgery  . Arthritis    arthritis-left hip, left ankle gout, bursitis left shoulder"recent injection 10-03-15"  . Bronchiectasis (HCC)    stable-sees MD at [facility] yearly"Asthma symptoms"  . Depression   . History of hiatal hernia   . Hypertension   . PONV (postoperative nausea and vomiting)    . Sleep apnea    cpap used, settings 9    Past Surgical History:  Procedure Laterality Date  . ABDOMINAL HYSTERECTOMY    . CHOLECYSTECTOMY     laparoscopic  . CYSTOSCOPY    . HAND SURGERY Left    "aneurysm repair"  . HIP FRACTURE SURGERY Left   . JOINT REPLACEMENT Right    RTHA  . TONSILLECTOMY    . TUBAL LIGATION      Prescriptions Prior to Admission  Medication Sig Dispense Refill Last Dose  . acetaminophen (TYLENOL) 500 MG tablet Take 1,000 mg by mouth every 6 (six) hours as needed for mild pain or moderate pain.   11/24/2015 at Unknown time  . allopurinol (ZYLOPRIM) 100 MG tablet Take 100 mg by mouth daily.   11/25/2015 at 0430  . amLODipine (NORVASC) 5 MG tablet Take 5 mg by mouth daily.   11/25/2015 at 0430  . HYDROcodone-acetaminophen (NORCO/VICODIN) 5-325 MG tablet Take 1 tablet by mouth every 6 (six) hours as needed for moderate pain.   11/24/2015 at Unknown time  . mometasone-formoterol (DULERA) 100-5 MCG/ACT AERO Inhale 2 puffs into the lungs 2 (two) times daily.    11/22/2015  . Multiple Vitamin (MULTIVITAMIN WITH MINERALS) TABS tablet Take 1 tablet by mouth daily.   Past Month at Unknown time  . omeprazole (PRILOSEC) 40 MG capsule Take 40 mg by mouth daily.   11/25/2015 at 0430  . Pseudoephedrine-Acetaminophen (NASAL DECONGESTANT SINUS PO) Take 1 tablet by mouth every 6 (six) hours as needed (For congestion.).   Past Month at Unknown time  . sertraline (ZOLOFT)  50 MG tablet Take 100 mg by mouth daily.   11/25/2015 at 0430  . Vitamin D, Ergocalciferol, (DRISDOL) 50000 units CAPS capsule Take 50,000 Units by mouth every Monday.    11/21/2015   Allergies  Allergen Reactions  . Sulfa Antibiotics Other (See Comments)    Flu like symptoms    Social History  Substance Use Topics  . Smoking status: Former Games developermoker  . Smokeless tobacco: Never Used     Comment: teenager yrs-2 yrs  . Alcohol use Yes     Comment: rare -social    History reviewed. No pertinent family  history.   Review of Systems  Musculoskeletal: Positive for joint pain.  All other systems reviewed and are negative.   Objective:  Physical Exam  Constitutional: She is oriented to person, place, and time. She appears well-developed and well-nourished.  HENT:  Head: Normocephalic and atraumatic.  Eyes: EOM are normal. Pupils are equal, round, and reactive to light.  Neck: Normal range of motion. Neck supple.  Cardiovascular: Normal rate and regular rhythm.   Respiratory: Effort normal and breath sounds normal.  GI: Soft. Bowel sounds are normal.  Musculoskeletal:       Left hip: She exhibits decreased range of motion, decreased strength, tenderness and bony tenderness.  Neurological: She is alert and oriented to person, place, and time.  Skin: Skin is warm and dry.  Psychiatric: She has a normal mood and affect.    Vital signs in last 24 hours: Temp:  [97.8 F (36.6 C)] 97.8 F (36.6 C) (10/20 0543) Pulse Rate:  [86] 86 (10/20 0543) Resp:  [18] 18 (10/20 0543) BP: (151)/(76) 151/76 (10/20 0543) SpO2:  [96 %] 96 % (10/20 0543) Weight:  [107 kg (236 lb)] 107 kg (236 lb) (10/20 0618)  Labs:   Estimated body mass index is 38.09 kg/m as calculated from the following:   Height as of this encounter: 5\' 6"  (1.676 m).   Weight as of this encounter: 107 kg (236 lb).   Imaging Review Plain radiographs demonstrate severe degenerative joint disease of the left hip(s). The bone quality appears to be good for age and reported activity level.  There are retained screws in her left hip.  Assessment/Plan:  Post-traumatic arthritis with osteonecrosis, left hip(s)  The patient history, physical examination, clinical judgement of the provider and imaging studies are consistent with end stage degenerative joint disease of the left hip(s) and total hip arthroplasty is deemed medically necessary. The treatment options including medical management, injection therapy, arthroscopy and  arthroplasty were discussed at length. The risks and benefits of total hip arthroplasty were presented and reviewed. The risks due to aseptic loosening, infection, stiffness, dislocation/subluxation,  thromboembolic complications and other imponderables were discussed.  The patient acknowledged the explanation, agreed to proceed with the plan and consent was signed. Patient is being admitted for inpatient treatment for surgery, pain control, PT, OT, prophylactic antibiotics, VTE prophylaxis, progressive ambulation and ADL's and discharge planning.The patient is planning to be discharged home with home health services

## 2015-11-25 NOTE — Transfer of Care (Signed)
Immediate Anesthesia Transfer of Care Note  Patient: [name] GuarneriShirley A [name]  Procedure(s) Performed: Procedure(s): REMOVAL OF CANNULATED SCREWS LEFT HIP AND LEFT TOTAL HIP ARTHROPLASTY ANTERIOR APPROACH (Left) HARDWARE REMOVAL (Left)  Patient Location: PACU  Anesthesia Type:General  Level of Consciousness: awake, alert , oriented and patient cooperative  Airway & Oxygen Therapy: Patient Spontanous Breathing and Patient connected to face mask oxygen  Post-op Assessment: Report given to RN, Post -op Vital signs reviewed and stable and Patient moving all extremities  Post vital signs: Reviewed and stable  Last Vitals:  Vitals:   11/25/15 0543  BP: (!) 151/76  Pulse: 86  Resp: 18  Temp: 36.6 C    Last Pain:  Vitals:   11/25/15 0618  TempSrc:   PainSc: 5       Patients Stated Pain Goal: 4 (11/25/15 0618)  Complications: No apparent anesthesia complications

## 2015-11-25 NOTE — Brief Op Note (Signed)
11/25/2015  9:20 AM  PATIENT:  [name] [name]  55 y.o. female  PRE-OPERATIVE DIAGNOSIS:  post-traumatic arthritis/avascular necrosis left hip  POST-OPERATIVE DIAGNOSIS:  post-traumatic arthritis/avascular necrosis left hip  PROCEDURE:  Procedure(s): REMOVAL OF CANNULATED SCREWS LEFT HIP AND LEFT TOTAL HIP ARTHROPLASTY ANTERIOR APPROACH (Left) HARDWARE REMOVAL (Left)  SURGEON:  Surgeon(s) and Role:    * Kathryne Hitchhristopher Y Blackman, MD - Primary  PHYSICIAN ASSISTANT: Rexene EdisonGil Clark, PA-C  ANESTHESIA:   general  EBL:  Total I/O In: -  Out: 550 [Urine:100; Blood:450]  COUNTS:  YES  DICTATION: .Other Dictation: Dictation Number [id number]  PLAN OF CARE: Admit to inpatient   PATIENT DISPOSITION:  PACU - hemodynamically stable.   Delay start of Pharmacological VTE agent (>24hrs) due to surgical blood loss or risk of bleeding: no

## 2015-11-25 NOTE — Anesthesia Postprocedure Evaluation (Signed)
Anesthesia Post Note  Patient: [name] [name]  Procedure(s) Performed: Procedure(s) (LRB): REMOVAL OF CANNULATED SCREWS LEFT HIP AND LEFT TOTAL HIP ARTHROPLASTY ANTERIOR APPROACH (Left) HARDWARE REMOVAL (Left)  Patient location during evaluation: PACU Anesthesia Type: MAC Level of consciousness: awake and alert Pain management: pain level controlled Vital Signs Assessment: post-procedure vital signs reviewed and stable Respiratory status: spontaneous breathing and respiratory function stable Cardiovascular status: stable Anesthetic complications: no    Last Vitals:  Vitals:   11/25/15 1030 11/25/15 1045  BP: (!) 176/103 (!) 150/73  Pulse: (!) 109 (!) 108  Resp: 18 15  Temp:      Last Pain:  Vitals:   11/25/15 0618  TempSrc:   PainSc: 5                  [name] DANIEL

## 2015-11-26 LAB — BASIC METABOLIC PANEL
Anion gap: 5 (ref 5–15)
BUN: 12 mg/dL (ref 6–20)
CHLORIDE: 107 mmol/L (ref 101–111)
CO2: 28 mmol/L (ref 22–32)
Calcium: 8.9 mg/dL (ref 8.9–10.3)
Creatinine, Ser: 0.68 mg/dL (ref 0.44–1.00)
GFR calc Af Amer: >60 mL/min (ref >60)
GFR calc non Af Amer: >60 mL/min (ref >60)
Glucose, Bld: 144 mg/dL — ABNORMAL HIGH (ref 65–99)
POTASSIUM: 4.3 mmol/L (ref 3.5–5.1)
SODIUM: 140 mmol/L (ref 135–145)

## 2015-11-26 LAB — CBC
HEMATOCRIT: 35.5 % — AB (ref 36.0–46.0)
HEMOGLOBIN: 11.4 g/dL — AB (ref 12.0–15.0)
MCH: 29.2 pg (ref 26.0–34.0)
MCHC: 32.1 g/dL (ref 30.0–36.0)
MCV: 91 fL (ref 78.0–100.0)
Platelets: 244 10*3/uL (ref 150–400)
RBC: 3.9 MIL/uL (ref 3.87–5.11)
RDW: 13.8 % (ref 11.5–15.5)
WBC: 10.8 10*3/uL — ABNORMAL HIGH (ref 4.0–10.5)

## 2015-11-26 MED ORDER — ASPIRIN 81 MG PO CHEW
324.0000 mg | CHEWABLE_TABLET | Freq: Two times a day (BID) | ORAL | 0 refills | Status: DC
Start: 2015-11-26 — End: 2017-09-04

## 2015-11-26 MED ORDER — ASPIRIN 81 MG PO CHEW
324.0000 mg | CHEWABLE_TABLET | Freq: Two times a day (BID) | ORAL | Status: DC
  Administered 2015-11-26: 324 mg via ORAL
  Filled 2015-11-26: qty 4

## 2015-11-26 MED ORDER — METHOCARBAMOL 500 MG PO TABS: 500.0000 mg | ORAL_TABLET | Freq: Four times a day (QID) | ORAL | 0 refills | Status: DC | PRN

## 2015-11-26 MED ORDER — OXYCODONE-ACETAMINOPHEN 5-325 MG PO TABS: 1.0000 | ORAL_TABLET | ORAL | 0 refills | Status: DC | PRN

## 2015-11-26 NOTE — Care Management Note (Signed)
Case Management Note  Patient Details  Name: [name] [name] MRN: [id number] Date of Birth: 05/01/1960  Subjective/Objective:  S/p L THR                  Action/Plan: Discharge Planning:   NCM spoke to pt and she has RW and bedside commode at home. [facility]. Husband at home to assist with care.   PCP - Cheral BayHAWKS, ALDENE N MD  Expected Discharge Date:  11/27/2015              Expected Discharge Plan:  Home w Home Health Services  In-House Referral:  NA  Discharge planning Services  CM Consult  Post Acute Care Choice:  Home Health Choice offered to:  Patient  DME Arranged:  N/A DME Agency:  NA  HH Arranged:  PT HH Agency:  [facility]  Status of Service:  Completed, signed off  If discussed at [employer] Length of Stay Meetings, dates discussed:    Additional Comments:  Elliot CousinShavis, Jenner Rosier Ellen, RN 11/26/2015, 3:26 PM

## 2015-11-26 NOTE — Discharge Instructions (Signed)

## 2015-11-26 NOTE — Progress Notes (Signed)
Physical Therapy Treatment Patient Details Name: [name] [name] MRN: [id number] DOB: [birth year] Today's Date: 11/26/2015    History of Present Illness Pt s/p L THR and with hx of R hemi hip from posterior approach and previous L hip pinning    PT Comments    Marked improvement in activity tolerance.  Follow Up Recommendations  Home health PT     Equipment Recommendations  None recommended by PT    Recommendations for Other Services OT consult     Precautions / Restrictions Precautions Precautions: Fall Restrictions Weight Bearing Restrictions: No Other Position/Activity Restrictions: WBAT    Mobility  Bed Mobility Overal bed mobility: Needs Assistance;+2 for physical assistance;+ 2 for safety/equipment Bed Mobility: Supine to Sit     Supine to sit: Min assist     General bed mobility comments: cues for seqeunce and use of R LE to self assist.  Physical assist to manage L LE and control trunk  Transfers Overall transfer level: Needs assistance Equipment used: Rolling walker (2 wheeled) Transfers: Sit to/from Stand Sit to Stand: Min assist         General transfer comment: cues for LE management and use of UEs to self assist  Ambulation/Gait Ambulation/Gait assistance: Min assist;Min guard Ambulation Distance (Feet): 100 Feet Assistive device: Rolling walker (2 wheeled) Gait Pattern/deviations: Step-to pattern;Decreased step length - right;Decreased step length - left;Shuffle;Trunk flexed Gait velocity: decr Gait velocity interpretation: Below normal speed for age/gender General Gait Details: cues for sequence, posture and position from [employer]            Wheelchair Mobility    Modified Rankin (Stroke Patients Only)       Balance                                    Cognition Arousal/Alertness: Awake/alert Behavior During Therapy: WFL for tasks assessed/performed Overall Cognitive Status: Within Functional Limits  for tasks assessed                      Exercises Total Joint Exercises Ankle Circles/Pumps: AROM;Both;15 reps;Supine Quad Sets: AROM;Both;10 reps;Supine Heel Slides: AAROM;Left;20 reps;Supine Hip ABduction/ADduction: AAROM;Left;15 reps;Supine    General Comments        Pertinent Vitals/Pain Pain Assessment: 0-10 Pain Score: 5  Pain Location: L hip Pain Descriptors / Indicators: Aching;Burning Pain Intervention(s): Limited activity within patient's tolerance;Monitored during session;Premedicated before session;Ice applied    Home Living                      Prior Function            PT Goals (current goals can now be found in the care plan section) Acute Rehab PT Goals Patient Stated Goal: Regain IND  PT Goal Formulation: With patient Time For Goal Achievement: 12/01/15 Potential to Achieve Goals: Good Progress towards PT goals: Progressing toward goals    Frequency    7X/week      PT Plan Current plan remains appropriate    Co-evaluation             End of Session Equipment Utilized During Treatment: Gait belt Activity Tolerance: Patient tolerated treatment well Patient left: in chair;with call bell/phone within reach;with family/visitor present     Time: 4098-1191 PT Time Calculation (min) (ACUTE ONLY): 29 min  Charges:  $Gait Training: 8-22 mins $Therapeutic Exercise: 8-22 mins  G Codes:      Valkyrie Guardiola 11/26/2015, 1:05 PM

## 2015-11-26 NOTE — Progress Notes (Signed)
OT Cancellation Note  Patient Details Name: Anette GuarneriShirley A Musselman MRN: [id number] DOB: 11-24-1960   Cancelled Treatment:    Reason Eval/Treat Not Completed: Pain limiting ability to participate -- patient received sitting up in recliner; reports she needs pain medication. Nurse made aware. Patient still has foley and IV. Will return tomorrow morning for OT session.  Xzandria Clevinger A 11/26/2015, 11:58 AM

## 2015-11-26 NOTE — Progress Notes (Signed)
   Subjective:  Patient reports pain as mild.    Objective:   VITALS:   Vitals:   11/26/15 0135 11/26/15 0536 11/26/15 0739 11/26/15 0916  BP: 130/74 (!) 127/56  (!) 140/56  Pulse: 89 78  83  Resp: 16 16  15   Temp: 98.7 F (37.1 C) 98.2 F (36.8 C)  99.1 F (37.3 C)  TempSrc: Oral Oral  Oral  SpO2: 98% 98% 98% 95%  Weight:      Height:        Neurologically intact Neurovascular intact Sensation intact distally Intact pulses distally Dorsiflexion/Plantar flexion intact Incision: dressing C/D/I and no drainage No cellulitis present Compartment soft   Lab Results  Component Value Date   WBC 10.8 (H) 11/26/2015   HGB 11.4 (L) 11/26/2015   HCT 35.5 (L) 11/26/2015   MCV 91.0 11/26/2015   PLT 244 11/26/2015     Assessment/Plan:  1 Day Post-Op   - Expected postop acute blood loss anemia - will monitor for symptoms - Up with PT/OT - DVT ppx - SCDs, ambulation, aspirin - WBAT operative extremity - Pain control - Discharge planning  Naiping Donnelly StagerM Xu 11/26/2015, 11:24 AM [phone]

## 2015-11-26 NOTE — Progress Notes (Signed)
Physical Therapy Treatment Patient Details Name: [name] [name] MRN: [id number] DOB: Aug 11, 1960 Today's Date: 11/26/2015    History of Present Illness Pt s/p L THR and with hx of R hemi hip from posterior approach and previous L hip pinning    PT Comments    Pt motivated and progressing well with mobility.  Will review stairs in am.  Follow Up Recommendations  Home health PT     Equipment Recommendations  None recommended by PT    Recommendations for Other Services OT consult     Precautions / Restrictions Precautions Precautions: Fall Restrictions Weight Bearing Restrictions: No Other Position/Activity Restrictions: WBAT    Mobility  Bed Mobility Overal bed mobility: Needs Assistance;+2 for physical assistance;+ 2 for safety/equipment Bed Mobility: Supine to Sit     Supine to sit: Min assist     General bed mobility comments: Pt in chair and requests back to chair to eat lunch  Transfers Overall transfer level: Needs assistance Equipment used: Rolling walker (2 wheeled) Transfers: Sit to/from Stand Sit to Stand: Min guard         General transfer comment: cues for LE management and use of UEs to self assist  Ambulation/Gait Ambulation/Gait assistance: Min guard Ambulation Distance (Feet): 240 Feet Assistive device: Rolling walker (2 wheeled) Gait Pattern/deviations: Step-to pattern;Step-through pattern;Decreased step length - right;Decreased step length - left;Shuffle;Trunk flexed Gait velocity: cues for posture, position from RW and initial sequence Gait velocity interpretation: Below normal speed for age/gender General Gait Details: cues for sequence, posture and position from RW   Stairs            Wheelchair Mobility    Modified Rankin (Stroke Patients Only)       Balance                                    Cognition Arousal/Alertness: Awake/alert Behavior During Therapy: WFL for tasks  assessed/performed Overall Cognitive Status: Within Functional Limits for tasks assessed                      Exercises Total Joint Exercises Ankle Circles/Pumps: AROM;Both;15 reps;Supine Quad Sets: AROM;Both;10 reps;Supine Heel Slides: AAROM;Left;20 reps;Supine Hip ABduction/ADduction: AAROM;Left;15 reps;Supine    General Comments        Pertinent Vitals/Pain Pain Assessment: 0-10 Pain Score: 5  Pain Location: L hip Pain Descriptors / Indicators: Aching;Sore Pain Intervention(s): Limited activity within patient's tolerance;Monitored during session;Premedicated before session;Ice applied    Home Living                      Prior Function            PT Goals (current goals can now be found in the care plan section) Acute Rehab PT Goals Patient Stated Goal: Regain IND  PT Goal Formulation: With patient Time For Goal Achievement: 12/01/15 Potential to Achieve Goals: Good Progress towards PT goals: Progressing toward goals    Frequency    7X/week      PT Plan Current plan remains appropriate    Co-evaluation             End of Session Equipment Utilized During Treatment: Gait belt Activity Tolerance: Patient tolerated treatment well Patient left: in chair;with call bell/phone within reach;with family/visitor present     Time: 4098-1191 PT Time Calculation (min) (ACUTE ONLY): 20 min  Charges:  $Gait Training: 8-22 mins $  Therapeutic Exercise: 8-22 mins                    G Codes:      [name] [name] 11/26/2015, 3:23 PM

## 2015-11-27 NOTE — Progress Notes (Signed)
   Subjective:  Patient reports pain as mild.    Objective:   VITALS:   Vitals:   11/26/15 0916 11/26/15 1331 11/26/15 2218 11/27/15 0519  BP: (!) 140/56 (!) 121/56 (!) 145/67 (!) 156/67  Pulse: 83 89 (!) 109 98  Resp: 15 16 18 20   Temp: 99.1 F (37.3 C) 98.8 F (37.1 C) 99.2 F (37.3 C) 98.7 F (37.1 C)  TempSrc: Oral Oral Oral Oral  SpO2: 95% 93% 94% 94%  Weight:      Height:        Neurologically intact Neurovascular intact Sensation intact distally Intact pulses distally Dorsiflexion/Plantar flexion intact Incision: dressing C/D/I and no drainage No cellulitis present Compartment soft   Lab Results  Component Value Date   WBC 10.8 (H) 11/26/2015   HGB 11.4 (L) 11/26/2015   HCT 35.5 (L) 11/26/2015   MCV 91.0 11/26/2015   PLT 244 11/26/2015     Assessment/Plan:  2 Days Post-Op   - stairs with PT today then home  Klover Priestly Donnelly StagerM Earla Charlie 11/27/2015, 8:34 AM [phone]

## 2015-11-27 NOTE — Progress Notes (Signed)
Physical Therapy Treatment Patient Details Name: [name] [name] MRN: [id number] DOB: Oct 03, 1960 Today's Date: 11/27/2015    History of Present Illness Pt s/p L THR and with hx of R hemi hip from posterior approach and previous L hip pinning    PT Comments    Pt progressing well with mobility. Stair training completed, reviewed HEP. Pt is ready to DC home from PT standpoint.   Follow Up Recommendations  Home health PT     Equipment Recommendations  None recommended by PT    Recommendations for Other Services OT consult     Precautions / Restrictions Precautions Precautions: Fall Restrictions Weight Bearing Restrictions: No Other Position/Activity Restrictions: WBAT    Mobility  Bed Mobility   Bed Mobility: Supine to Sit     Supine to sit: Modified independent (Device/Increase time)        Transfers Overall transfer level: Needs assistance Equipment used: Rolling walker (2 wheeled) Transfers: Sit to/from Stand Sit to Stand: Modified independent (Device/Increase time)         General transfer comment: good safety awareness and hand placement, uses rail to push up  Ambulation/Gait Ambulation/Gait assistance: Modified independent (Device/Increase time) Ambulation Distance (Feet): 240 Feet Assistive device: Rolling walker (2 wheeled) Gait Pattern/deviations: Step-to pattern   Gait velocity interpretation: Below normal speed for age/gender General Gait Details: steady with RW   Stairs Stairs: Yes Stairs assistance: Min guard Stair Management: Two rails;Forwards;Step to pattern Number of Stairs: 3 General stair comments: VCs for sequencing  Wheelchair Mobility    Modified Rankin (Stroke Patients Only)       Balance                                    Cognition Arousal/Alertness: Awake/alert Behavior During Therapy: WFL for tasks assessed/performed Overall Cognitive Status: Within Functional Limits for tasks assessed                      Exercises Total Joint Exercises Ankle Circles/Pumps: AROM;Both;15 reps;Supine Short Arc Quad: AROM;Left;10 reps;Supine Heel Slides: AAROM;Left;Supine;15 reps Hip ABduction/ADduction: AAROM;Left;Supine;15 reps    General Comments        Pertinent Vitals/Pain Pain Score: 3  Pain Location: L hip Pain Descriptors / Indicators: Sore Pain Intervention(s): Premedicated before session;Monitored during session;Limited activity within patient's tolerance;Ice applied    Home Living                      Prior Function            PT Goals (current goals can now be found in the care plan section) Acute Rehab PT Goals Patient Stated Goal: play pickleball PT Goal Formulation: With patient Time For Goal Achievement: 12/01/15 Potential to Achieve Goals: Good Progress towards PT goals: Progressing toward goals    Frequency    7X/week      PT Plan Current plan remains appropriate    Co-evaluation             End of Session Equipment Utilized During Treatment: Gait belt Activity Tolerance: Patient tolerated treatment well Patient left: in chair;with call bell/phone within reach     Time: 0850-0913 PT Time Calculation (min) (ACUTE ONLY): 23 min  Charges:  $Gait Training: 8-22 mins $Therapeutic Exercise: 8-22 mins                    G Codes:  Ralene Bathe Kistler 11/27/2015, 9:20 AM [phone]

## 2015-11-27 NOTE — Progress Notes (Signed)
OT Cancellation Note  Patient Details Name: [name] GuarneriShirley A [name] MRN: [id number] DOB: 1960-05-25   Cancelled Treatment:    Reason Eval/Treat Not Completed: OT screened, no needs identified, will sign off. Pt states she has shower chair and 3in1 from previous surgery and she also has AE for LB self care. She states she knows how to use AE to dress. She declined need to practice 3in1 or shower transfers. Will sign off.  Judithann SaugerStone, Audie Wieser ClaxtonStafford 11/27/2015, 9:39 AM

## 2015-11-27 NOTE — Discharge Summary (Signed)
Physician Discharge Summary      Patient ID: [name] [name] MRN: [id number] DOB/AGE: July 12, 1960 55 y.o.  Admit date: 11/25/2015 Discharge date: 11/27/2015  Admission Diagnoses:  Traumatic arthritis of left hip  Discharge Diagnoses:  Principal Problem:   Traumatic arthritis of left hip Active Problems:   Status post left hip replacement   Past Medical History:  Diagnosis Date  . Aneurysm (HCC)    left hand"caused blood left thumb" surgery by Dr. Merlyn Lot "repair of aneurysm"- all resolved with surgery  . Arthritis    arthritis-left hip, left ankle gout, bursitis left shoulder"recent injection 10-03-15"  . Bronchiectasis (HCC)    stable-sees MD at [facility] yearly"Asthma symptoms"  . Depression   . History of hiatal hernia   . Hypertension   . PONV (postoperative nausea and vomiting)   . Sleep apnea    cpap used, settings 9    Surgeries: Procedure(s): REMOVAL OF CANNULATED SCREWS LEFT HIP AND LEFT TOTAL HIP ARTHROPLASTY ANTERIOR APPROACH HARDWARE REMOVAL on 11/25/2015   Consultants (if any):   Discharged Condition: Improved  Hospital Course: [name] [name] is an 55 y.o. female who was admitted 11/25/2015 with a diagnosis of Traumatic arthritis of left hip and went to the operating room on 11/25/2015 and underwent the above named procedures.    She was given perioperative antibiotics:  Anti-infectives    Start     Dose/Rate Route Frequency Ordered Stop   11/25/15 1400  ceFAZolin (ANCEF) IVPB 1 g/50 mL premix     1 g 100 mL/hr over 30 Minutes Intravenous Every 6 hours 11/25/15 1140 11/25/15 2018   11/25/15 0606  ceFAZolin (ANCEF) IVPB 2g/100 mL premix     2 g 200 mL/hr over 30 Minutes Intravenous On call to O.R. 11/25/15 4098 11/25/15 0754    .  She was given sequential compression devices, early ambulation, and aspirin for DVT prophylaxis.  She benefited maximally from the hospital stay and there were no complications.    Recent  vital signs:  Vitals:   11/26/15 2218 11/27/15 0519  BP: (!) 145/67 (!) 156/67  Pulse: (!) 109 98  Resp: 18 20  Temp: 99.2 F (37.3 C) 98.7 F (37.1 C)    Recent laboratory studies:  Lab Results  Component Value Date   HGB 11.4 (L) 11/26/2015   HGB 14.1 11/17/2015   HGB 14.1 10/12/2015   Lab Results  Component Value Date   WBC 10.8 (H) 11/26/2015   PLT 244 11/26/2015   Lab Results  Component Value Date   INR 1.37 07/30/2010   Lab Results  Component Value Date   NA 140 11/26/2015   K 4.3 11/26/2015   CL 107 11/26/2015   CO2 28 11/26/2015   BUN 12 11/26/2015   CREATININE 0.68 11/26/2015   GLUCOSE 144 (H) 11/26/2015    Discharge Medications:     Medication List    TAKE these medications   acetaminophen 500 MG tablet Commonly known as:  TYLENOL Take 1,000 mg by mouth every 6 (six) hours as needed for mild pain or moderate pain.   allopurinol 100 MG tablet Commonly known as:  ZYLOPRIM Take 100 mg by mouth daily.   amLODipine 5 MG tablet Commonly known as:  NORVASC Take 5 mg by mouth daily.   aspirin 81 MG chewable tablet Chew 4 tablets (324 mg total) by mouth 2 (two) times daily.   HYDROcodone-acetaminophen 5-325 MG tablet Commonly known as:  NORCO/VICODIN Take 1 tablet by mouth every 6 (  six) hours as needed for moderate pain.   methocarbamol 500 MG tablet Commonly known as:  ROBAXIN Take 1 tablet (500 mg total) by mouth every 6 (six) hours as needed for muscle spasms.   mometasone-formoterol 100-5 MCG/ACT Aero Commonly known as:  DULERA Inhale 2 puffs into the lungs 2 (two) times daily.   multivitamin with minerals Tabs tablet Take 1 tablet by mouth daily.   NASAL DECONGESTANT SINUS PO Take 1 tablet by mouth every 6 (six) hours as needed (For congestion.).   omeprazole 40 MG capsule Commonly known as:  PRILOSEC Take 40 mg by mouth daily.   oxyCODONE-acetaminophen 5-325 MG tablet Commonly known as:  ROXICET Take 1-2 tablets by mouth every 4  (four) hours as needed.   sertraline 50 MG tablet Commonly known as:  ZOLOFT Take 100 mg by mouth daily.   Vitamin D (Ergocalciferol) 50000 units Caps capsule Commonly known as:  DRISDOL Take 50,000 Units by mouth every Monday.       Diagnostic Studies: Dg C-arm 61-120 Min-no Report  Result Date: 11/25/2015 CLINICAL DATA: surgery C-ARM 61-120 MINUTES Fluoroscopy was utilized by the requesting physician.  No radiographic interpretation.   Dg Hip Port Unilat With Pelvis 1v Left  Result Date: 11/25/2015 CLINICAL DATA:  Status post total hip replacement on the left EXAM: DG HIP (WITH OR WITHOUT PELVIS) 2V PORT LEFT COMPARISON:  Intraoperative images obtained earlier in the day FINDINGS: Frontal pelvis and lateral left hip images obtained. There is a total hip prosthesis on the right. There is a new total hip prosthesis on the left. Prosthetic components bilaterally appear well seated. There is soft tissue air on the left, an expected postoperative finding. No fracture or dislocation. Myositis 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 borderline protrusio acetabuli on the right. IMPRESSION: Total hip prostheses bilaterally with prosthetic components appearing well-seated. No acute fracture or dislocation. Air on the left is an expected postoperative finding. 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 Electronically Signed   By: Bretta Bang III M.D.   On: 11/25/2015 10:39   Dg Hip Operative Unilat W Or W/o Pelvis Left  Result Date: 11/25/2015 CLINICAL DATA:  Total hip replacement EXAM: DG C-ARM 61-120 MIN-NO REPORT; OPERATIVE LEFT HIP COMPARISON:  None. FLUOROSCOPY TIME:  0 minutes 30 seconds ; 2 acquired images FINDINGS: Frontal view shows a total hip prosthesis on the left with prosthetic components well-seated. No acute fracture or dislocation. There is also a total hip prosthesis on the right. IMPRESSION: Total hip prostheses bilaterally. The prosthesis on  the left has components which appear intact on frontal view. No acute fracture or dislocation evident. Electronically Signed   By: Bretta Bang III M.D.   On: 11/25/2015 09:42    Disposition: 06-Home-Health Care Svc  Discharge Instructions    Call MD / Call 911    Complete by:  As directed    If you experience chest pain or shortness of breath, CALL 911 and be transported to the hospital emergency room.  If you develope a fever above 101.5 F, pus (white drainage) or increased drainage or redness at the wound, or calf pain, call your surgeon's office.   Constipation Prevention    Complete by:  As directed    Drink plenty of fluids.  Prune juice may be helpful.  You may use a stool softener, such as Colace (over the counter) 100 mg twice a day.  Use MiraLax (over the counter) for constipation as needed.  Diet - low sodium heart healthy    Complete by:  As directed    Driving restrictions    Complete by:  As directed    No driving while taking narcotic pain meds.   Increase activity slowly as tolerated    Complete by:  As directed       Follow-up Information    [facility] .   Specialty:  Home Health Services Why:  Home Health Physical Therapy Contact information: [address] [place] [address] [phone]        Kathryne Hitchhristopher Y Blackman, MD Follow up in 2 week(s).   Specialty:  Orthopedic Surgery Contact information: [address] [place] [address] [phone]            Signed: Tarry Kosaiping M Bryann Mcnealy 11/27/2015, 8:35 AM

## 2015-11-29 ENCOUNTER — Telehealth (INDEPENDENT_AMBULATORY_CARE_PROVIDER_SITE_OTHER): Payer: Self-pay | Admitting: Orthopaedic Surgery

## 2015-11-29 NOTE — Telephone Encounter (Signed)
Spoke to patient and let her know that she can still take the softner, and she may take the laxative if that isn't working

## 2015-11-29 NOTE — Telephone Encounter (Signed)
Verbal order given to Joe.

## 2015-11-29 NOTE — Telephone Encounter (Signed)
[facility].  Verbal Orders: 2 times a week for this week                          3 times a week for next week  Contact Info: Royston SinnerJoseph Miller [phone]

## 2015-11-29 NOTE — Telephone Encounter (Signed)
Lisabeth PickKaleigh called in regards to this patient medication. Patient was taking a stool softener while in the hospital due to constipation. Needs to know if the patient should continue with a stool softener or what laxative can used due to have not bowel movement in a week.  Contact Info: Lisabeth PickKaleigh [phone]

## 2015-11-30 MED ORDER — CIPROFLOXACIN HCL 500 MG PO TABS
500.0000 mg | ORAL_TABLET | Freq: Two times a day (BID) | ORAL | 0 refills | Status: DC
Start: 2015-11-30 — End: 2017-09-04

## 2015-11-30 NOTE — Telephone Encounter (Signed)
I sent in cipro to her pharmacy for uti

## 2015-11-30 NOTE — Telephone Encounter (Signed)
Patient aware this was called in  

## 2015-11-30 NOTE — Telephone Encounter (Signed)
Patient thinks that she has a UTI and would like a RX for this.

## 2015-11-30 NOTE — Telephone Encounter (Signed)
Please advise 

## 2015-12-08 ENCOUNTER — Ambulatory Visit (INDEPENDENT_AMBULATORY_CARE_PROVIDER_SITE_OTHER): Payer: Medicare Other | Admitting: Orthopaedic Surgery

## 2015-12-08 DIAGNOSIS — Z96642 Presence of left artificial hip joint: Secondary | ICD-10-CM

## 2015-12-08 NOTE — Progress Notes (Signed)
The patient is 2 weeks status post hardware removal from her left hip and then placement of a left total hip arthroplasty through a direct anterior approach.  She is already just ambulating with a cane. She has stopped her aspirin. She feels better overall.  On examination of her left hip incision looks great. There is no evidence infection. I removed one suture. She does have a seroma and I was able to aspirate her on 150 260 mL of fluid off her hip and this gave her great relief. Her hip is excellent range of motion in her leg lengths are equal. Her limp is minimal.  She'll continue increase her activities as comfort allows. I don't need to see her back for 4 weeks. She'll stop her aspirin. He can stop using the can as she is more comfortable.

## 2016-01-04 ENCOUNTER — Ambulatory Visit (INDEPENDENT_AMBULATORY_CARE_PROVIDER_SITE_OTHER): Payer: BLUE CROSS/BLUE SHIELD | Admitting: Physician Assistant

## 2016-01-04 DIAGNOSIS — Z96642 Presence of left artificial hip joint: Secondary | ICD-10-CM

## 2016-01-04 NOTE — Progress Notes (Signed)
Office Visit Note   Patient: [name] [name]           Date of Birth: 1960-11-04           MRN: [id number] Visit Date: 01/04/2016              Requested by: Cheral BayAldene N Hawks, MD [address] [facility] [id number] PCP: Cheral BayHAWKS,ALDENE N, MD   Assessment & Plan: Visit Diagnoses:  1. History of total left hip arthroplasty     Plan: We'll see her back in 1 month mainly just check the seroma. She is activities as tolerated without precautions.  Follow-Up Instructions: Return in about 4 weeks (around 02/01/2016) for wound check.   Orders:  No orders of the defined types were placed in this encounter.  No orders of the defined types were placed in this encounter.     Procedures: No procedures performed   Clinical Data: No additional findings.   Subjective: Chief Complaint  Patient presents with  . Left Hip - Follow-up    Patient states she is doing very well.    HPI  Review of Systems   Objective: Vital Signs: There were no vitals taken for this visit.  Physical Exam  Constitutional: She appears well-developed and well-nourished. No distress.    Ortho Exam Left hip surgical incisions healing well. Very small seroma. Calf supple and nontender. Good range of motion left hip without pain. Specialty Comments:  No specialty comments available.  Imaging: No results found.   PMFS History: Patient Active Problem List   Diagnosis Date Noted  . Traumatic arthritis of left hip 11/25/2015  . Status post left hip replacement 11/25/2015  . OBESITY 11/04/2006  . POSTNASAL DRIP SYNDROME 11/04/2006  . DISC DISEASE, LUMBAR 11/04/2006  . DYSPNEA 11/04/2006  . COUGH, CHRONIC 11/04/2006  . HYSTERECTOMY, HX OF 11/04/2006  . CHOLECYSTECTOMY, HX OF 11/04/2006   Past Medical History:  Diagnosis Date  . Aneurysm (HCC)    left hand"caused blood left thumb" surgery by Dr. Merlyn LotKuzma "repair of aneurysm"- all resolved with surgery  . Arthritis     arthritis-left hip, left ankle gout, bursitis left shoulder"recent injection 10-03-15"  . Bronchiectasis (HCC)    stable-[facility]-sees yearly"Asthma symptoms"  . Depression   . History of hiatal hernia   . Hypertension   . PONV (postoperative nausea and vomiting)   . Sleep apnea    cpap used, settings 9    No family history on file.  Past Surgical History:  Procedure Laterality Date  . ABDOMINAL HYSTERECTOMY    . CHOLECYSTECTOMY     laparoscopic  . CYSTOSCOPY    . HAND SURGERY Left    "aneurysm repair"  . HARDWARE REMOVAL Left 11/25/2015   Procedure: HARDWARE REMOVAL;  Surgeon: Kathryne Hitchhristopher Y Blackman, MD;  Location: [facility];  Service: Orthopedics;  Laterality: Left;  . HIP FRACTURE SURGERY Left   . JOINT REPLACEMENT Right    RTHA  . TONSILLECTOMY    . TOTAL HIP ARTHROPLASTY Left 11/25/2015   Procedure: REMOVAL OF CANNULATED SCREWS LEFT HIP AND LEFT TOTAL HIP ARTHROPLASTY ANTERIOR APPROACH;  Surgeon: Kathryne Hitchhristopher Y Blackman, MD;  Location: [facility];  Service: Orthopedics;  Laterality: Left;  . TUBAL LIGATION     Social History   Occupational History  . Not on file.   Social History Main Topics  . Smoking status: Former Games developermoker  . Smokeless tobacco: Never Used     Comment: teenager  yrs-4 yrs  . Alcohol use Yes     Comment: rare -social  . Drug use: No  . Sexual activity: Yes

## 2016-02-02 ENCOUNTER — Encounter (INDEPENDENT_AMBULATORY_CARE_PROVIDER_SITE_OTHER): Payer: Self-pay | Admitting: Orthopaedic Surgery

## 2016-02-02 ENCOUNTER — Ambulatory Visit (INDEPENDENT_AMBULATORY_CARE_PROVIDER_SITE_OTHER): Payer: Medicare Other | Admitting: Orthopaedic Surgery

## 2016-02-02 DIAGNOSIS — Z96642 Presence of left artificial hip joint: Secondary | ICD-10-CM

## 2016-02-02 NOTE — Progress Notes (Signed)
The patient is now about 9-10 weeks status post a left total hip arthroplasty. 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 She is walking without any assistive device and doing well. She takes Tylenol once a week and says she has no pain in her hip at all.  On examination of her incision is well-healed. She is subjective numbness around the incision but no evidence of seroma this point. Her leg lengths are equal. She is walking without a limp without an assistive device.  At this point she'll continue increase her activities to tolerance. I do not need see her back for 6 months. At that visit I would like just a low AP pelvis.

## 2016-11-20 ENCOUNTER — Ambulatory Visit (INDEPENDENT_AMBULATORY_CARE_PROVIDER_SITE_OTHER): Payer: Medicare Other | Admitting: Physician Assistant

## 2016-11-20 ENCOUNTER — Ambulatory Visit (INDEPENDENT_AMBULATORY_CARE_PROVIDER_SITE_OTHER): Payer: Medicare Other

## 2016-11-20 ENCOUNTER — Other Ambulatory Visit (INDEPENDENT_AMBULATORY_CARE_PROVIDER_SITE_OTHER): Payer: Self-pay

## 2016-11-20 DIAGNOSIS — Z96642 Presence of left artificial hip joint: Secondary | ICD-10-CM | POA: Diagnosis not present

## 2016-11-20 DIAGNOSIS — M7062 Trochanteric bursitis, left hip: Secondary | ICD-10-CM

## 2016-11-20 DIAGNOSIS — M2550 Pain in unspecified joint: Secondary | ICD-10-CM

## 2016-11-20 MED ORDER — METHYLPREDNISOLONE ACETATE 40 MG/ML IJ SUSP
40.0000 mg | INTRAMUSCULAR | Status: AC | PRN
  Administered 2016-11-20: 40 mg via INTRA_ARTICULAR

## 2016-11-20 MED ORDER — LIDOCAINE HCL 1 % IJ SOLN
3.0000 mL | INTRAMUSCULAR | Status: AC | PRN
  Administered 2016-11-20: 3 mL

## 2016-11-20 NOTE — Progress Notes (Signed)
Office Visit Note   Patient: [name] [name]           Date of Birth: September 01, 1960           MRN: [id number] Visit Date: 11/20/2016              Requested by: Cheral Bay, MD [address] [facility] [place], [place] [address] PCP: Cheral Bay, MD   Assessment & Plan: Visit Diagnoses:  1. History of left hip replacement     Plan: she show my T band stretching exercises and I did have her demonstrate these. She'll follow with [place] on an as-needed basis. We'll refer to retired rheumatologist here in detail due to her arthritis, multiple joint aches, 1 positive  AN/A and one  negative ANA.    Follow-Up Instructions: Return if symptoms worsen or fail to improve.   Orders:  Orders Placed This Encounter  Procedures  . Large Joint Injection/Arthrocentesis  . XR Pelvis 1-2 Views   No orders of the defined types were placed in this encounter.     Procedures: Large Joint Inj Date/Time: 11/20/2016 10:09 AM Performed by: Kirtland Bouchard Authorized by: Kirtland Bouchard   Consent Given by:  Patient Site marked: the procedure site was marked   Indications:  Pain Location:  Hip Site:  L greater trochanter Needle Size:  22 G Needle Length:  1.5 inches Approach:  Lateral Ultrasound Guidance: No   Fluoroscopic Guidance: No   Arthrogram: No   Medications:  40 mg methylPREDNISolone acetate 40 MG/ML; 3 mL lidocaine 1 % Aspiration Attempted: No   Patient tolerance:  Patient tolerated the procedure well with no immediate complications     Clinical Data: No additional findings.   Subjective: Left hip follow-up  HPI [name] [name] is a 56 year old female who returns today 1 year status post removal of cannulated screws from left hip with placement of a left total hip arthroplasty.she states overall that her left hip is doing well. She has good range of motion good strength of the hip. No real problems with the hip. Occasionally pain. She also has a  history of a[place] hip on the right which she has make pain and occasionally. She's concerned due to the fact that she had iritis and has multiple joint aches. She reports that she had one positive N/A and then a repeat a and it was negative. She is due to see a rheumatologist in December but would like a referral to a rheumatologist hearing [name] [name]. Review of Systems Positive for multiple joint aches. Denies fevers chills.  Objective: Vital Signs: There were no vitals taken for this visit.  Physical Exam Well-developed well-nourished female in no acute distress.Mood and Affect appropriate Ortho Exam Bilateral hips good range of motion without pain. Tenderness over the left hip trochanteric region. Surgical incisions are left hip are all well-healed no signs of infection Specialty Comments:  No specialty comments available.  Imaging: Xr Pelvis 1-2 Views  Result Date: 11/20/2016 AP pelvis: No acute fractures. Left total hip well seated no signs of infection. No change in overall position of the [place] hip on the right. No gross hardware failure.    PMFS History: Patient Active Problem List   Diagnosis Date Noted  . Traumatic arthritis of left hip 11/25/2015  . Status post left hip replacement 11/25/2015  . OBESITY 11/04/2006  . POSTNASAL DRIP SYNDROME 11/04/2006  . DISC DISEASE, LUMBAR 11/04/2006  . DYSPNEA 11/04/2006  . COUGH, CHRONIC  11/04/2006  . HYSTERECTOMY, HX OF 11/04/2006  . CHOLECYSTECTOMY, HX OF 11/04/2006   Past Medical History:  Diagnosis Date  . Aneurysm (HCC)    left hand"caused blood left thumb" surgery by Dr. Merlyn Lot "repair of aneurysm"- all resolved with surgery  . Arthritis    arthritis-left hip, left ankle gout, bursitis left shoulder"recent injection 10-03-15"  . Bronchiectasis (HCC)    stable-sees MD at [facility] yearly"Asthma symptoms"  . Depression   . History of hiatal hernia   . Hypertension   . PONV (postoperative nausea  and vomiting)   . Sleep apnea    cpap used, settings 9    No family history on file.  Past Surgical History:  Procedure Laterality Date  . ABDOMINAL HYSTERECTOMY    . CHOLECYSTECTOMY     laparoscopic  . CYSTOSCOPY    . HAND SURGERY Left    "aneurysm repair"  . HARDWARE REMOVAL Left 11/25/2015   Procedure: HARDWARE REMOVAL;  Surgeon: Kathryne Hitch, MD;  Location: [facility];  Service: Orthopedics;  Laterality: Left;  . HIP FRACTURE SURGERY Left   . JOINT REPLACEMENT Right    RTHA  . TONSILLECTOMY    . TOTAL HIP ARTHROPLASTY Left 11/25/2015   Procedure: REMOVAL OF CANNULATED SCREWS LEFT HIP AND LEFT TOTAL HIP ARTHROPLASTY ANTERIOR APPROACH;  Surgeon: Kathryne Hitch, MD;  Location: [facility];  Service: Orthopedics;  Laterality: Left;  . TUBAL LIGATION     Social History   Occupational History  . Not on file.   Social History Main Topics  . Smoking status: Former Games developer  . Smokeless tobacco: Never Used     Comment: teenager yrs-4 yrs  . Alcohol use Yes     Comment: rare -social  . Drug use: No  . Sexual activity: Yes

## 2017-05-02 NOTE — Progress Notes (Signed)
 Office Visit Note  Patient: [name] [name]             Date of Birth: 11/16/1960           MRN: [id number]             PCP: Hawks, Aldene N, MD Referring: Hawks, Aldene N, MD Visit Date: 05/16/2017 Occupation: @GUAROCC@    Subjective:  New Patient (Initial Visit) (Abnormal labs, gout, iritis)   History of Present Illness: [name] [name] is a 57 y.o. female consultation per request of her PCP.  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  She states is resolved after the treatment.  She had no recurrence until August 2018 when she was seen by an ophthalmologist.  She had some lab work which was abnormal.  She was treated with steroid eyedrops and did quite well.  She had another recurrence of iritis in February 2019 which was treated with eyedrops again.  She states for the last 2 days she is having some irritation in her right eye again.  She also recalls having lower back pain since she was a teenager.  She states over time she has seen several physicians.  She has been seeing Dr. Wang for facet joint injections for the last 10 years.  She states she gets about 1-2 injections/year but has not had one in the last 3 years.  She has been also having neck pain for the last 4-5 months.  Recently she has been experiencing left first, second, third finger numbness and weakness.  She has an appointment coming up with the neurologist.  She recalls having a nerve conduction velocity which showed the etiology was radiculopathy from cervical region. 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  She states soon after she went on vacation where she had seafood and some alcoholic beverages and she developed a gout flare in her left ankle joint which was treated with colchicine and allopurinol.  She was referred to Dr. Gay who advised coming off allopurinol and weight loss.  Patient states it did not work and she had a flare of gout for which she  had to start allopurinol again.  She has not had a flare in the last 4 years. She gives history of osteoarthritis of bilateral hip joints.  She has right partial [facility] in 2009.  She also fractured her left hip approximately 8 years ago.  Eventually she had left total hip replacement in 2018.  She continues to have some discomfort in her hip joints.  She also gives history of discomfort in her bilateral knee joints, bilateral hands and bilateral feet.  She denies any joint swelling. Gives history of photosensitivity, hair loss and fatigue.  She is concerned that she may have lupus.  Activities of Daily Living:  Patient reports morning stiffness for 1 hour.   Patient Reports nocturnal pain.  Difficulty dressing/grooming: Denies Difficulty climbing stairs: Denies Difficulty getting out of chair: Denies Difficulty using hands for taps, buttons, cutlery, and/or writing: Reports   Review of Systems  Constitutional: Positive for fatigue. Negative for night sweats, weight gain and weight loss.  HENT: Negative for mouth sores, trouble swallowing, trouble swallowing, mouth dryness and nose dryness.   Eyes: Positive for redness and dryness. Negative for pain, itching and visual disturbance.  Respiratory: Negative for cough, shortness of breath and difficulty breathing.   Cardiovascular: Negative for chest pain, palpitations,   hypertension, irregular heartbeat and swelling in legs/feet.  Gastrointestinal: Positive for constipation. Negative for blood in stool and diarrhea.  Endocrine: Negative for increased urination.  Genitourinary: Negative for difficulty urinating and vaginal dryness.  Musculoskeletal: Positive for arthralgias, joint pain, muscle weakness, morning stiffness and muscle tenderness. Negative for joint swelling, myalgias and myalgias.  Skin: Positive for rash, hair loss and sensitivity to sunlight. Negative for color change, skin tightness and ulcers.    Allergic/Immunologic: Negative for susceptible to infections.  Neurological: Positive for numbness. Negative for dizziness, memory loss, night sweats and weakness.  Hematological: Negative for bruising/bleeding tendency and swollen glands.  Psychiatric/Behavioral: Negative for depressed mood and sleep disturbance. The patient is not nervous/anxious.     PMFS History:  Patient Active Problem List   Diagnosis Date Noted  . Essential hypertension 05/16/2017  . History of depression 05/16/2017  . History of sleep apnea 05/16/2017  . Idiopathic chronic gout of multiple sites without tophus 05/16/2017  . Iritis 05/16/2017  . History of right hip replacement 05/16/2017  . Traumatic arthritis of left hip 11/25/2015  . Status post left hip replacement 11/25/2015  . OBESITY 11/04/2006  . POSTNASAL DRIP SYNDROME 11/04/2006  . DDD (degenerative disc disease), lumbar 11/04/2006  . DYSPNEA 11/04/2006  . COUGH, CHRONIC 11/04/2006  . HYSTERECTOMY, HX OF 11/04/2006  . CHOLECYSTECTOMY, HX OF 11/04/2006    Past Medical History:  Diagnosis Date  . Aneurysm (HCC)    left hand"caused blood left thumb" surgery by Dr. Kuzma "repair of aneurysm"- all resolved with surgery  . Arthritis    arthritis-left hip, left ankle gout, bursitis left shoulder"recent injection 10-03-15"  . Bronchiectasis (HCC)    stable-sees MD at [facility]-sees yearly"Asthma symptoms"  . Depression   . History of hiatal hernia   . Hypertension   . PONV (postoperative nausea and vomiting)   . Sleep apnea    cpap used, settings 9    Family History  Problem Relation Age of Onset  . Heart disease Mother   . Heart disease Father    Past Surgical History:  Procedure Laterality Date  . ABDOMINAL HYSTERECTOMY    . CHOLECYSTECTOMY     laparoscopic  . CYSTOSCOPY    . HAND SURGERY Left    "aneurysm repair"  . HARDWARE REMOVAL Left 11/25/2015   Procedure: HARDWARE REMOVAL;  Surgeon: Christopher Y Blackman, MD;   Location: [facility];  Service: Orthopedics;  Laterality: Left;  . HIP FRACTURE SURGERY Left   . JOINT REPLACEMENT Right    RTHA  . TONSILLECTOMY    . TOTAL HIP ARTHROPLASTY Left 11/25/2015   Procedure: REMOVAL OF CANNULATED SCREWS LEFT HIP AND LEFT TOTAL HIP ARTHROPLASTY ANTERIOR APPROACH;  Surgeon: Christopher Y Blackman, MD;  Location: [facility];  Service: Orthopedics;  Laterality: Left;  . TUBAL LIGATION     Social History   Social History Narrative  . Not on file     Objective: Vital Signs: BP 124/67 (BP Location: Left Arm, Patient Position: Sitting, Cuff Size: Normal)   Pulse 80   Resp 16   Ht 5' 6" (1.676 m)   Wt 201 lb (91.2 kg)   BMI 32.44 kg/m    Physical Exam  Constitutional: She is oriented to person, place, and time. She appears well-developed and well-nourished.  HENT:  Head: Normocephalic and atraumatic.  Eyes: Conjunctivae and EOM are normal.  Neck: Normal range of motion.  Cardiovascular: Normal rate, regular rhythm, normal heart sounds and intact distal pulses.  Pulmonary/Chest: Effort   normal and breath sounds normal.  Abdominal: Soft. Bowel sounds are normal.  Lymphadenopathy:    She has no cervical adenopathy.  Neurological: She is alert and oriented to person, place, and time.  Skin: Skin is warm and dry. Capillary refill takes less than 2 seconds.  Psychiatric: She has a normal mood and affect. Her behavior is normal.  Nursing note and vitals reviewed.    Musculoskeletal Exam: C-spine thoracic and lumbar spine fairly good range of motion with some discomfort.  She had no SI joint tenderness.  Shoulder joints elbow joints wrist joints are good range of motion.  She has some DIP PIP thickening.  No synovitis over MCPs or wrist joints was noted.  She has bilateral hip replacement but fairly good range of motion.  She has some crepitus in her knee joints without any warmth swelling or effusion.  She has some DIP PIP thickening in her feet with no  synovitis.  CDAI Exam: No CDAI exam completed.    Investigation: No additional findings. CBC Latest Ref Rng & Units 11/26/2015 11/17/2015 10/12/2015  WBC 4.0 - 10.5 K/uL 10.8(H) 8.2 9.2  Hemoglobin 12.0 - 15.0 g/dL 11.4(L) 14.1 14.1  Hematocrit 36.0 - 46.0 % 35.5(L) 43.8 42.5  Platelets 150 - 400 K/uL 244 290 265   CMP Latest Ref Rng & Units 11/26/2015 11/17/2015 10/12/2015  Glucose 65 - 99 mg/dL 144(H) 105(H) 94  BUN 6 - 20 mg/dL 12 18 20  Creatinine 0.44 - 1.00 mg/dL 0.68 0.87 0.85  Sodium 135 - 145 mmol/L 140 138 141  Potassium 3.5 - 5.1 mmol/L 4.3 4.1 4.4  Chloride 101 - 111 mmol/L 107 105 107  CO2 22 - 32 mmol/L 28 26 27  Calcium 8.9 - 10.3 mg/dL 8.9 9.8 9.7  Total Protein 6.0 - 8.3 g/dL - - -  Total Bilirubin 0.3 - 1.2 mg/dL - - -  Alkaline Phos 39 - 117 U/L - - -  AST 0 - 37 U/L - - -  ALT 0 - 35 U/L - - -  September 28, 2016 ANA 1: 80 nucleolar, anticentromere negative, SCL 70-, CRP 13.9 elevated, FTA negative, Lyme disease negative, rheumatoid factor negative, CBC normal  Imaging: Xr Cervical Spine 2 Or 3 Views  Result Date: 05/16/2017 Loss of cervical lordosis was noted.  Mild C5-6 narrowing was noted with anterior spurring. Impression mild spondylosis of the cervical spine.   Xr Foot 2 Views Left  Result Date: 05/16/2017 First MTP, all PIP and DIP narrowing was noted.  No erosive changes were noted.  No intertarsal joint space narrowing was noted.  Calcaneal spur was noted.  Impression: These findings are consistent with osteoarthritis of the foot.  Xr Foot 2 Views Right  Result Date: 05/16/2017 First MTP, all PIP and DIP narrowing was noted.  No erosive changes were noted.  No intertarsal joint space narrowing was noted.  Calcaneal spur was noted.  Impression: These findings are consistent with osteoarthritis of the foot.  Xr Hand 2 View Left  Result Date: 05/16/2017 PIP and DIP narrowing was noted.  No MCP changes, intercarpal or radiocarpal changes were noted.   Narrowing of the CMC joint was noted. Impression: These findings are consistent with osteoarthritis of the hand.  Xr Hand 2 View Right  Result Date: 05/16/2017 PIP and DIP narrowing was noted.  No MCP changes, intercarpal or radiocarpal changes were noted.  Narrowing of the CMC joint was noted. Impression: These findings are consistent with osteoarthritis of the hand.    Xr Lumbar Spine 2-3 Views  Result Date: 05/16/2017 Mild multilevel spondylosis was noted with L4-5 narrowing.  Facet joint arthropathy was noted.  No syndesmophytes were noted.  Impression: Mild spondylosis and facet joint arthropathy   Speciality Comments: No specialty comments available.    Procedures:  No procedures performed Allergies: Sulfa antibiotics   Assessment / Plan:     Visit Diagnoses: Polyarthralgia: Patient complains of pain in multiple joints.  Iritis - h/o recurrent iritis.  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  She was in remission for a while until last year.  She has been followed by ophthalmologist.  All her autoimmune workup has been negative so far.  Her ANA was low titer positive.  Positive ANA 1: 80 nucleolar pattern.  Patient is concerned that she may have lupus with a history of fatigue hair loss photosensitivity and rash.  I will obtain some additional labs today which are listed as follows.  Pain in both hands -clinical findings are consistent with osteoarthritis of her hands.  Plan: XR Hand 2 View Right, XR Hand 2 View Left.  X-rays were consistent with mild osteoarthritis in her hands.  Pain in both feet -she does have some osteoarthritic changes in her feet.  She complains of chronic feet discomfort.  Plan: XR Foot 2 Views Right, XR Foot 2 Views Left.  The x-rays revealed osteoarthritic changes in her bilateral feet.  Idiopathic chronic gout of multiple sites without tophus - on Allopurinol 100 mg po qd.  Patient denies any gout flares in the last few years on  allopurinol.  DDD (degenerative disc disease), cervical - Plan: XR Cervical Spine 2 or 3 views.  She continues to have a lot of discomfort in her C-spine.  Loss of cervical lordosis and mild spondylosis was noted.  She states she has left-sided radiculopathy with left hand numbness.  DDD (degenerative disc disease), lumbar -she has been having increased lower back pain.  Plan: XR Lumbar Spine 2-3 Views.  X-rays revealed multilevel spondylosis and facet joint arthropathy.  Status post left hip replacement - 2018.  Doing well she has good range of motion.  History of right hip replacement - [facility] in [facility].  She has good range of motion.  Other medical problems are listed as follows:  Essential hypertension  History of depression  History of sleep apnea  Vitamin deficiency    Orders: Orders Placed This Encounter  Procedures  . XR Hand 2 View Right  . XR Hand 2 View Left  . XR Foot 2 Views Right  . XR Foot 2 Views Left  . XR Cervical Spine 2 or 3 views  . XR Lumbar Spine 2-3 Views  . CBC with Differential/Platelet  . COMPLETE METABOLIC PANEL WITH GFR  . Sedimentation rate  . C3 and C4  . ANA  . Urinalysis, Routine w reflex microscopic  . CK  . TSH  . RNP Antibody  . Anti-Smith antibody  . Sjogrens syndrome-A extractable nuclear antibody  . Sjogrens syndrome-B extractable nuclear antibody  . Anti-DNA antibody, double-stranded  . Pan-ANCA  . Angiotensin converting enzyme  . HLA-B27 antigen  . Glucose 6 phosphate dehydrogenase  . Serum protein electrophoresis with reflex  . Uric acid   No orders of the defined types were placed in this encounter.   Face-to-face time spent with patient was 60 minutes.  Greater than 50% of time was spent in counseling and coordination of care.  Follow-Up Instructions:  No follow-ups on file.   Bo Merino, MD  Note - This record has been created using Editor, commissioning.  Chart creation errors have been sought, but may not  always  have been located. Such creation errors do not reflect on  the standard of medical care.

## 2017-05-16 ENCOUNTER — Encounter: Payer: Self-pay | Admitting: Rheumatology

## 2017-05-16 ENCOUNTER — Ambulatory Visit (INDEPENDENT_AMBULATORY_CARE_PROVIDER_SITE_OTHER): Payer: Self-pay

## 2017-05-16 ENCOUNTER — Ambulatory Visit: Payer: Medicare Other | Admitting: Rheumatology

## 2017-05-16 VITALS — BP 124/67 | HR 80 | Resp 16 | Ht 66.0 in | Wt 201.0 lb

## 2017-05-16 DIAGNOSIS — I10 Essential (primary) hypertension: Secondary | ICD-10-CM | POA: Diagnosis not present

## 2017-05-16 DIAGNOSIS — M5030 Other cervical disc degeneration, unspecified cervical region: Secondary | ICD-10-CM

## 2017-05-16 DIAGNOSIS — M79671 Pain in right foot: Secondary | ICD-10-CM

## 2017-05-16 DIAGNOSIS — Z96642 Presence of left artificial hip joint: Secondary | ICD-10-CM | POA: Diagnosis not present

## 2017-05-16 DIAGNOSIS — M2550 Pain in unspecified joint: Secondary | ICD-10-CM | POA: Diagnosis not present

## 2017-05-16 DIAGNOSIS — M5136 Other intervertebral disc degeneration, lumbar region: Secondary | ICD-10-CM

## 2017-05-16 DIAGNOSIS — Z8659 Personal history of other mental and behavioral disorders: Secondary | ICD-10-CM | POA: Diagnosis not present

## 2017-05-16 DIAGNOSIS — M51369 Other intervertebral disc degeneration, lumbar region without mention of lumbar back pain or lower extremity pain: Secondary | ICD-10-CM

## 2017-05-16 DIAGNOSIS — Z8669 Personal history of other diseases of the nervous system and sense organs: Secondary | ICD-10-CM | POA: Diagnosis not present

## 2017-05-16 DIAGNOSIS — M79641 Pain in right hand: Secondary | ICD-10-CM | POA: Diagnosis not present

## 2017-05-16 DIAGNOSIS — Z96641 Presence of right artificial hip joint: Secondary | ICD-10-CM

## 2017-05-16 DIAGNOSIS — E569 Vitamin deficiency, unspecified: Secondary | ICD-10-CM

## 2017-05-16 DIAGNOSIS — M1A09X0 Idiopathic chronic gout, multiple sites, without tophus (tophi): Secondary | ICD-10-CM

## 2017-05-16 DIAGNOSIS — M79642 Pain in left hand: Secondary | ICD-10-CM | POA: Diagnosis not present

## 2017-05-16 DIAGNOSIS — R5383 Other fatigue: Secondary | ICD-10-CM

## 2017-05-16 DIAGNOSIS — R768 Other specified abnormal immunological findings in serum: Secondary | ICD-10-CM

## 2017-05-16 DIAGNOSIS — H209 Unspecified iridocyclitis: Secondary | ICD-10-CM | POA: Diagnosis not present

## 2017-05-16 DIAGNOSIS — Z96643 Presence of artificial hip joint, bilateral: Secondary | ICD-10-CM | POA: Insufficient documentation

## 2017-05-16 DIAGNOSIS — M79672 Pain in left foot: Secondary | ICD-10-CM | POA: Diagnosis not present

## 2017-05-20 LAB — HLA-B27 ANTIGEN: HLA-B27 Antigen: POSITIVE — AB

## 2017-05-20 LAB — PROTEIN ELECTROPHORESIS, SERUM, WITH REFLEX
ALPHA 1: 0.3 g/dL (ref 0.2–0.3)
Albumin ELP: 4.3 g/dL (ref 3.8–4.8)
Alpha 2: 0.6 g/dL (ref 0.5–0.9)
BETA 2: 0.4 g/dL (ref 0.2–0.5)
BETA GLOBULIN: 0.4 g/dL (ref 0.4–0.6)
GAMMA GLOBULIN: 1 g/dL (ref 0.8–1.7)
TOTAL PROTEIN: 7 g/dL (ref 6.1–8.1)

## 2017-05-20 LAB — CBC WITH DIFFERENTIAL/PLATELET
BASOS PCT: 0.7 %
Basophils Absolute: 50 cells/uL (ref 0–200)
Eosinophils Absolute: 192 cells/uL (ref 15–500)
Eosinophils Relative: 2.7 %
HEMATOCRIT: 42.4 % (ref 35.0–45.0)
HEMOGLOBIN: 14.3 g/dL (ref 11.7–15.5)
LYMPHS ABS: 1697 {cells}/uL (ref 850–3900)
MCH: 29.2 pg (ref 27.0–33.0)
MCHC: 33.7 g/dL (ref 32.0–36.0)
MCV: 86.5 fL (ref 80.0–100.0)
MPV: 10 fL (ref 7.5–12.5)
Monocytes Relative: 5 %
Neutro Abs: 4807 cells/uL (ref 1500–7800)
Neutrophils Relative %: 67.7 %
Platelets: 294 10*3/uL (ref 140–400)
RBC: 4.9 10*6/uL (ref 3.80–5.10)
RDW: 13.1 % (ref 11.0–15.0)
Total Lymphocyte: 23.9 %
WBC: 7.1 10*3/uL (ref 3.8–10.8)
WBCMIX: 355 {cells}/uL (ref 200–950)

## 2017-05-20 LAB — COMPLETE METABOLIC PANEL WITH GFR
AG RATIO: 1.8 (calc) (ref 1.0–2.5)
ALT: 18 U/L (ref 6–29)
AST: 23 U/L (ref 10–35)
Albumin: 4.6 g/dL (ref 3.6–5.1)
Alkaline phosphatase (APISO): 76 U/L (ref 33–130)
BUN: 15 mg/dL (ref 7–25)
CALCIUM: 10.4 mg/dL (ref 8.6–10.4)
CO2: 30 mmol/L (ref 20–32)
Chloride: 105 mmol/L (ref 98–110)
Creat: 0.99 mg/dL (ref 0.50–1.05)
GFR, EST AFRICAN AMERICAN: 72 mL/min/{1.73_m2} (ref >=60)
GFR, EST NON AFRICAN AMERICAN: 62 mL/min/{1.73_m2} (ref >=60)
GLOBULIN: 2.5 g/dL (ref 1.9–3.7)
Glucose, Bld: 91 mg/dL (ref 65–99)
POTASSIUM: 4.5 mmol/L (ref 3.5–5.3)
SODIUM: 140 mmol/L (ref 135–146)
TOTAL PROTEIN: 7.1 g/dL (ref 6.1–8.1)
Total Bilirubin: 0.6 mg/dL (ref 0.2–1.2)

## 2017-05-20 LAB — URINALYSIS, ROUTINE W REFLEX MICROSCOPIC
Bilirubin Urine: NEGATIVE
Glucose, UA: NEGATIVE
HGB URINE DIPSTICK: NEGATIVE
Ketones, ur: NEGATIVE
LEUKOCYTES UA: NEGATIVE
NITRITE: NEGATIVE
PH: 7 (ref 5.0–8.0)
PROTEIN: NEGATIVE
Specific Gravity, Urine: 1.01 (ref 1.001–1.03)

## 2017-05-20 LAB — SJOGRENS SYNDROME-A EXTRACTABLE NUCLEAR ANTIBODY: SSA (Ro) (ENA) Antibody, IgG: <1 AI

## 2017-05-20 LAB — PAN-ANCA
ANCA Screen: NEGATIVE
Myeloperoxidase Abs: <1 AI
Serine Protease 3: <1 AI

## 2017-05-20 LAB — C3 AND C4
C3 COMPLEMENT: 138 mg/dL (ref 83–193)
C4 Complement: 23 mg/dL (ref 15–57)

## 2017-05-20 LAB — SJOGRENS SYNDROME-B EXTRACTABLE NUCLEAR ANTIBODY: SSB (La) (ENA) Antibody, IgG: <1 AI

## 2017-05-20 LAB — SEDIMENTATION RATE: Sed Rate: 6 mm/h (ref 0–30)

## 2017-05-20 LAB — ANGIOTENSIN CONVERTING ENZYME: Angiotensin-Converting Enzyme: 40 U/L (ref 9–67)

## 2017-05-20 LAB — ANTI-DNA ANTIBODY, DOUBLE-STRANDED: ds DNA Ab: <1 IU/mL

## 2017-05-20 LAB — ANTI-SMITH ANTIBODY: ENA SM AB SER-ACNC: NEGATIVE AI

## 2017-05-20 LAB — URIC ACID: URIC ACID, SERUM: 4.7 mg/dL (ref 2.5–7.0)

## 2017-05-20 LAB — ANA: ANA: NEGATIVE

## 2017-05-20 LAB — GLUCOSE 6 PHOSPHATE DEHYDROGENASE: G-6PDH: 19.3 U/g Hgb (ref 7.0–20.5)

## 2017-05-20 LAB — CK: Total CK: 122 U/L (ref 29–143)

## 2017-05-20 LAB — RNP ANTIBODY: RIBONUCLEIC PROTEIN(ENA) ANTIBODY, IGG: NEGATIVE AI

## 2017-05-20 LAB — TSH: TSH: 1.89 mIU/L (ref 0.40–4.50)

## 2017-05-21 NOTE — Progress Notes (Signed)
Will discuss at the fu visit

## 2017-06-18 ENCOUNTER — Ambulatory Visit: Payer: Medicare Other | Admitting: Rheumatology

## 2017-07-03 ENCOUNTER — Ambulatory Visit: Payer: Medicare Other | Admitting: Neurology

## 2017-07-03 ENCOUNTER — Telehealth: Payer: Self-pay | Admitting: Neurology

## 2017-07-03 ENCOUNTER — Other Ambulatory Visit: Payer: Self-pay

## 2017-07-03 ENCOUNTER — Encounter: Payer: Self-pay | Admitting: Neurology

## 2017-07-03 DIAGNOSIS — M5412 Radiculopathy, cervical region: Secondary | ICD-10-CM | POA: Insufficient documentation

## 2017-07-03 DIAGNOSIS — M5030 Other cervical disc degeneration, unspecified cervical region: Secondary | ICD-10-CM

## 2017-07-03 MED ORDER — MELOXICAM 15 MG PO TABS: 15.0000 mg | ORAL_TABLET | Freq: Every day | ORAL | 3 refills | Status: AC | PRN

## 2017-07-03 MED ORDER — GABAPENTIN 300 MG PO CAPS: 300.0000 mg | ORAL_CAPSULE | Freq: Two times a day (BID) | ORAL | 3 refills | Status: DC

## 2017-07-03 NOTE — Telephone Encounter (Signed)
[name] [name]: [id number] (exp. 07/03/17 to 08/02/17) faxed order to [facility] for her to have the open MRI.

## 2017-07-03 NOTE — Progress Notes (Signed)
PATIENT: [name] [name] DOB: [birth year]  Chief Complaint  Patient presents with  . Neck Pain    Rm. 4.  PCP: Fredia Beets.  She c/o numbness left thumb, index and middle fingers, left shoulder pain onset months ago.  X-rays of c-spine and EMG/NCV; pt. sts. she was told she had a pinched nerve, and referred here for treatment.  Also sts. has had pain right eye. Sts. Seen by Tepidino in [facility] and dx. with Iritis.  C/O dry eye, dry mouth.  Has seen Dr. Sharmon Revere in [facility], sts. was told labs were borderline and she may have Lupus vs RA./fim  . Numbness  . Iritis     HISTORICAL  MARELLY [name] is a 57 year old female, seen in refer by her primary care doctor Derek Jack for evaluation of neck pain, initial evaluation was on Jul 03, 2017.  She had a history of chronic neck pain, gradually getting worse over the past few years, since beginning of 2019, she noticed significant worsening, radiating pain from left neck to left shoulder, left arm, sometimes involving first 3 fingers, subjective weakness,  She had a history of left palm aneurysm repair surgery more than 10 years ago, she presented with left thumb embolic event, surgery has been helpful, current symptoms are different from her mild residual intermittent left thumb irritation,   She complains of her left neck pain 8 out of 10, sometimes trigger her headaches,  I reviewed EMG/NCS at [facility] hospital referred by her primary care physician Dr. Lendon Colonel on April 19, 2017, there was mildly increased insertional activity and mildly polyphasic motor unit in the left first dorsal interossei and triceps, and there was also mildly increased insertional activity in the left deltoid and the left C6 paraspinal muscles, which she raised the possibility of mild subacute left C7 radiculopathy, there is no evidence of left carpal tunnel or cubital tunnel syndromes on the left side.  X-ray of cervical report on May 16, 2017, loss of  cervical lordosis, mild C5-6 narrowing with anterior spurring,  REVIEW OF SYSTEMS: Full 14 system review of systems performed and notable only for excessive thirst, shortness of breath, chest tightness, palpitation, numbness, tremor, walking difficulty  ALLERGIES: Allergies  Allergen Reactions  . Sulfa Antibiotics Other (See Comments)    Flu like symptoms    HOME MEDICATIONS: Current Outpatient Medications  Medication Sig Dispense Refill  . acetaminophen (TYLENOL) 500 MG tablet Take 1,000 mg by mouth every 6 (six) hours as needed for mild pain or moderate pain.    Marland Kitchen allopurinol (ZYLOPRIM) 100 MG tablet Take 100 mg by mouth daily.    Marland Kitchen amLODipine (NORVASC) 5 MG tablet Take 5 mg by mouth daily.    . fluticasone (FLONASE) 50 MCG/ACT nasal spray USE 2 SPRAYS IN EACH NOSTRIL DAILY.    . IBUPROFEN IB PO Take by mouth as needed.    . Multiple Vitamin (MULTIVITAMIN WITH MINERALS) TABS tablet Take 1 tablet by mouth daily.    Marland Kitchen omeprazole (PRILOSEC) 40 MG capsule Take 40 mg by mouth daily.    Marland Kitchen oxyCODONE-acetaminophen (ROXICET) 5-325 MG tablet Take 1-2 tablets by mouth every 4 (four) hours as needed. 60 tablet 0  . Pseudoephedrine-Acetaminophen (NASAL DECONGESTANT SINUS PO) Take 1 tablet by mouth every 6 (six) hours as needed (For congestion.).    Marland Kitchen sertraline (ZOLOFT) 50 MG tablet Take 100 mg by mouth daily.    Marland Kitchen aspirin 81 MG chewable tablet Chew 4 tablets (324 mg total)  by mouth 2 (two) times daily. (Patient not taking: Reported on 05/16/2017) 30 tablet 0  . benzonatate (TESSALON) 200 MG capsule TAKE 1 CAPSULE (200 MG TOTAL) BY MOUTH 2 (TWO) TIMES DAILY AS NEEDED FOR COUGH.  5  . ciprofloxacin (CIPRO) 500 MG tablet Take 1 tablet (500 mg total) by mouth 2 (two) times daily. (Patient not taking: Reported on 05/16/2017) 10 tablet 0  . HYDROcodone-acetaminophen (NORCO/VICODIN) 5-325 MG tablet Take 1 tablet by mouth every 6 (six) hours as needed for moderate pain.    . methocarbamol (ROBAXIN) 500 MG  tablet Take 1 tablet (500 mg total) by mouth every 6 (six) hours as needed for muscle spasms. (Patient not taking: Reported on 05/16/2017) 60 tablet 0  . mometasone-formoterol (DULERA) 100-5 MCG/ACT AERO Inhale 2 puffs into the lungs 2 (two) times daily.     . mupirocin ointment (BACTROBAN) 2 %     . triazolam (HALCION) 0.25 MG tablet Take by mouth as needed.     . triazolam (HALCION) 0.25 MG tablet Take by mouth.    . Vitamin D, Ergocalciferol, (DRISDOL) 50000 units CAPS capsule Take 50,000 Units by mouth every Monday.      No current facility-administered medications for this visit.     PAST MEDICAL HISTORY: Past Medical History:  Diagnosis Date  . Aneurysm (HCC)    left hand"caused blood left thumb" surgery by Dr. Merlyn Lot "repair of aneurysm"- all resolved with surgery  . Arthritis    arthritis-left hip, left ankle gout, bursitis left shoulder"recent injection 10-03-15"  . Bronchiectasis (HCC)    stable-sees MD at [facility] yearly"Asthma symptoms"  . Depression   . History of hiatal hernia   . Hypertension   . PONV (postoperative nausea and vomiting)   . Sleep apnea    cpap used, settings 9  . Vision abnormalities     PAST SURGICAL HISTORY: Past Surgical History:  Procedure Laterality Date  . ABDOMINAL HYSTERECTOMY    . CHOLECYSTECTOMY     laparoscopic  . CYSTOSCOPY    . HAND SURGERY Left    "aneurysm repair"  . HARDWARE REMOVAL Left 11/25/2015   Procedure: HARDWARE REMOVAL;  Surgeon: Kathryne Hitch, MD;  Location: [facility];  Service: Orthopedics;  Laterality: Left;  . HIP FRACTURE SURGERY Left   . JOINT REPLACEMENT Right    RTHA  . TONSILLECTOMY    . TOTAL HIP ARTHROPLASTY Left 11/25/2015   Procedure: REMOVAL OF CANNULATED SCREWS LEFT HIP AND LEFT TOTAL HIP ARTHROPLASTY ANTERIOR APPROACH;  Surgeon: Kathryne Hitch, MD;  Location: [facility];  Service: Orthopedics;  Laterality: Left;  . TUBAL LIGATION      FAMILY HISTORY: Family History    Problem Relation Age of Onset  . Heart disease Mother   . Heart disease Father   . Healthy Brother   . Ulcerative colitis Daughter     SOCIAL HISTORY:  Social History   Socioeconomic History  . Marital status: Married    Spouse name: Not on file  . Number of children: Not on file  . Years of education: Not on file  . Highest education level: Not on file  Occupational History  . Not on file  Social Needs  . Financial resource strain: Not on file  . Food insecurity:    Worry: Not on file    Inability: Not on file  . Transportation needs:    Medical: Not on file    Non-medical: Not on file  Tobacco Use  . Smoking  status: Former Smoker    Packs/day: 0.50    Years: 4.00    Pack years: 2.00    Types: Cigarettes    Last attempt to quit: 05/17/1998    Years since quitting: 19.1  . Smokeless tobacco: Never Used  Substance and Sexual Activity  . Alcohol use: Yes    Comment: rare -social  . Drug use: No  . Sexual activity: Yes  Lifestyle  . Physical activity:    Days per week: Not on file    Minutes per session: Not on file  . Stress: Not on file  Relationships  . Social connections:    Talks on phone: Not on file    Gets together: Not on file    Attends religious service: Not on file    Active member of club or organization: Not on file    Attends meetings of clubs or organizations: Not on file    Relationship status: Not on file  . Intimate partner violence:    Fear of current or ex partner: Not on file    Emotionally abused: Not on file    Physically abused: Not on file    Forced sexual activity: Not on file  Other Topics Concern  . Not on file  Social History Narrative  . Not on file     PHYSICAL EXAM   Vitals:   07/03/17 0721  BP: 132/80  Pulse: 67  Resp: 18  Weight: 188 lb (85.3 kg)  Height:  (1.676 m)    Not recorded      Body mass index is 30.34 kg/m.  PHYSICAL EXAMNIATION:  Gen: NAD, conversant, well nourised, obese, well  groomed                     Cardiovascular: Regular rate rhythm, no peripheral edema, warm, nontender. Eyes: Conjunctivae clear without exudates or hemorrhage Neck: Supple, no carotid bruits. Pulmonary: Clear to auscultation bilaterally   NEUROLOGICAL EXAM:  MENTAL STATUS: Speech:    Speech is normal; fluent and spontaneous with normal comprehension.  Cognition:     Orientation to time, place and person     Normal recent and remote memory     Normal Attention span and concentration     Normal Language, naming, repeating,spontaneous speech     Fund of knowledge   CRANIAL NERVES: CN II: Visual fields are full to confrontation. Fundoscopic exam is normal with sharp discs and no vascular changes. Pupils are round equal and briskly reactive to light. CN III, IV, VI: extraocular movement are normal. No ptosis. CN V: Facial sensation is intact to pinprick in all 3 divisions bilaterally. Corneal responses are intact.  CN VII: Face is symmetric with normal eye closure and smile. CN VIII: Hearing is normal to rubbing fingers CN IX, X: Palate elevates symmetrically. Phonation is normal. CN XI: Head turning and shoulder shrug are intact CN XII: Tongue is midline with normal movements and no atrophy.  MOTOR: There is no pronator drift of out-stretched arms. Muscle bulk and tone are normal. Muscle strength is normal.  REFLEXES: Reflexes are 2+ and symmetric at the biceps, triceps, knees, and ankles. Plantar responses are flexor.  SENSORY: Intact to light touch, pinprick, positional sensation and vibratory sensation are intact in fingers and toes.  COORDINATION: Rapid alternating movements and fine finger movements are intact. There is no dysmetria on finger-to-nose and heel-knee-shin.    GAIT/STANCE: Posture is normal. Gait is steady with normal steps, base, arm swing, and  turning. Heel and toe walking are normal. Tandem gait is normal.  Romberg is absent.   DIAGNOSTIC DATA (LABS,  IMAGING, TESTING) - I reviewed patient records, labs, notes, testing and imaging myself where available.   ASSESSMENT AND PLAN  ADVIKA MCLELLAND is a 57 y.o. female   Possible left cervical radiculopathy  MRI of cervical  Gabapentin 300 mg 3 times daily, Mobic as needed   Levert Feinstein, M.D. Ph.D.  [facility] [address], [address] [place], [place] [address] Ph: [phone] Fax: [phone]  CC: Cheral Bay, MD

## 2017-07-03 NOTE — Telephone Encounter (Signed)
pending faxed clinical notes. °

## 2017-07-15 DIAGNOSIS — M19042 Primary osteoarthritis, left hand: Secondary | ICD-10-CM

## 2017-07-15 DIAGNOSIS — M19071 Primary osteoarthritis, right ankle and foot: Secondary | ICD-10-CM | POA: Insufficient documentation

## 2017-07-15 DIAGNOSIS — M19072 Primary osteoarthritis, left ankle and foot: Secondary | ICD-10-CM

## 2017-07-15 DIAGNOSIS — M5030 Other cervical disc degeneration, unspecified cervical region: Secondary | ICD-10-CM | POA: Insufficient documentation

## 2017-07-15 DIAGNOSIS — M19041 Primary osteoarthritis, right hand: Secondary | ICD-10-CM | POA: Insufficient documentation

## 2017-07-15 NOTE — Progress Notes (Signed)
Office Visit Note  Patient: [name] [name]             Date of Birth: [birth year]           MRN: [id number]             PCP: Maylon Peppers, MD Referring: Maylon Peppers, MD Visit Date: 07/25/2017 Occupation: '@GUAROCC'$ @    Subjective:  Recurrent iritis  History of Present Illness: [name] [name] is a 57 y.o. female with history of recurrent iritis.  She states she has had 2 episodes since January and she is feeling some irritation in her right eye currently.  She had recently noticed some swelling in her left second and third finger DIP joints.  She continues to have some neck is stiffness from underlying disc disease.  She denies any personal history of psoriasis.  Her brother has psoriasis.  Her daughter has ulcerative colitis.  Activities of Daily Living:  Patient reports morning stiffness for 10-15  minutes.   Patient Reports nocturnal pain.  Difficulty dressing/grooming: Denies Difficulty climbing stairs: Denies Difficulty getting out of chair: Denies Difficulty using hands for taps, buttons, cutlery, and/or writing: Denies   Review of Systems  Constitutional: Negative for fatigue.  HENT: Positive for mouth dryness. Negative for mouth sores and nose dryness.   Eyes: Positive for redness and dryness. Negative for pain and visual disturbance.  Respiratory: Positive for cough (Chronic cough ). Negative for hemoptysis, shortness of breath and difficulty breathing.        History of bronchiectasis  Cardiovascular: Negative for chest pain, palpitations, hypertension and swelling in legs/feet.  Gastrointestinal: Negative for blood in stool, constipation and diarrhea.  Endocrine: Negative for increased urination.  Genitourinary: Negative for painful urination.  Musculoskeletal: Positive for arthralgias, joint pain, joint swelling and morning stiffness. Negative for myalgias, muscle weakness, muscle tenderness and myalgias.  Skin: Positive for hair loss. Negative for  color change, pallor, rash, nodules/bumps, skin tightness, ulcers and sensitivity to sunlight.  Allergic/Immunologic: Negative for susceptible to infections.  Neurological: Negative for dizziness, numbness, headaches and weakness.  Hematological: Negative for swollen glands.  Psychiatric/Behavioral: Positive for depressed mood. Negative for sleep disturbance. The patient is not nervous/anxious.     PMFS History:  Patient Active Problem List   Diagnosis Date Noted  . Family history of psoriasis in brother 07/25/2017  . Family history of ulcerative colitis 07/25/2017  . Primary osteoarthritis of both hands 07/15/2017  . Primary osteoarthritis of both feet 07/15/2017  . DDD (degenerative disc disease), cervical 07/15/2017  . Left cervical radiculopathy 07/03/2017  . Essential hypertension 05/16/2017  . History of depression 05/16/2017  . History of sleep apnea 05/16/2017  . Idiopathic chronic gout of multiple sites without tophus 05/16/2017  . Iritis 05/16/2017  . Status post hip replacement, bilateral 05/16/2017  . Traumatic arthritis of left hip 11/25/2015  . OBESITY 11/04/2006  . POSTNASAL DRIP SYNDROME 11/04/2006  . DDD (degenerative disc disease), lumbar 11/04/2006  . DYSPNEA 11/04/2006  . COUGH, CHRONIC 11/04/2006  . HYSTERECTOMY, HX OF 11/04/2006  . CHOLECYSTECTOMY, HX OF 11/04/2006    Past Medical History:  Diagnosis Date  . Aneurysm ([place])    left hand"caused blood left thumb" surgery by Dr. Fredna Dow "repair of aneurysm"- all resolved with surgery  . Arthritis    arthritis-left hip, left ankle gout, bursitis left shoulder"recent injection 10-03-15"  . Bronchiectasis ([place])    stable-sees MD at [facility] yearly"Asthma symptoms"  . Depression   . History  of hiatal hernia   . Hypertension   . PONV (postoperative nausea and vomiting)   . Sleep apnea    cpap used, settings 9  . Vision abnormalities     Family History  Problem Relation Age of Onset  . Heart  disease Mother   . Heart disease Father   . Healthy Brother   . Ulcerative colitis Daughter    Past Surgical History:  Procedure Laterality Date  . ABDOMINAL HYSTERECTOMY    . CHOLECYSTECTOMY     laparoscopic  . CYSTOSCOPY    . HAND SURGERY Left    "aneurysm repair"  . HARDWARE REMOVAL Left 11/25/2015   Procedure: HARDWARE REMOVAL;  Surgeon: Mcarthur Rossetti, MD;  Location: [facility];  Service: Orthopedics;  Laterality: Left;  . HIP FRACTURE SURGERY Left   . JOINT REPLACEMENT Right    RTHA  . TONSILLECTOMY    . TOTAL HIP ARTHROPLASTY Left 11/25/2015   Procedure: REMOVAL OF CANNULATED SCREWS LEFT HIP AND LEFT TOTAL HIP ARTHROPLASTY ANTERIOR APPROACH;  Surgeon: Mcarthur Rossetti, MD;  Location: [facility];  Service: Orthopedics;  Laterality: Left;  . TUBAL LIGATION     Social History   Social History Narrative  . Not on file     Objective: Vital Signs: BP 127/82 (BP Location: Left Arm, Patient Position: Sitting, Cuff Size: Normal)   Pulse 82   Resp 15   Ht '5\' 6"'$  (1.676 m)   Wt 187 lb (84.8 kg)   BMI 30.18 kg/m    Physical Exam  Constitutional: She is oriented to person, place, and time. She appears well-developed and well-nourished.  HENT:  Head: Normocephalic and atraumatic.  Eyes: Conjunctivae and EOM are normal.  Neck: Normal range of motion.  Cardiovascular: Normal rate, regular rhythm, normal heart sounds and intact distal pulses.  Pulmonary/Chest: Effort normal and breath sounds normal.  Abdominal: Soft. Bowel sounds are normal.  Lymphadenopathy:    She has no cervical adenopathy.  Neurological: She is alert and oriented to person, place, and time.  Skin: Skin is warm and dry. Capillary refill takes less than 2 seconds.  Psychiatric: She has a normal mood and affect. Her behavior is normal.  Nursing note and vitals reviewed.    Musculoskeletal Exam: C-spine, thoracic and lumbar spine limited range of motion with some discomfort.  Shoulder joints, elbow  joints, wrist joints were in good range of motion with no synovitis.  She has some DIP thickening with a mucinous cyst on her left second DIP.  Hip joints, knee joints, ankles PIPs and DIPs were in good range of motion with no synovitis.  No synovitis was noted.  She had no SI joint tenderness.  CDAI Exam: No CDAI exam completed.    Investigation: No additional findings. May 16, 2017 CBC normal, CMP normal, TSH normal, CK normal, UA negative, SPEP normal, G6PD normal, ANCA negative, ESR 6, ANA negative, ENA negative, C3-C4 normal, ACE 40, uric acid 4.7, HLA-B27 positive  Imaging: No results found.  Speciality Comments: No specialty comments available.    Procedures:  No procedures performed Allergies: Sulfa antibiotics   Assessment / Plan:     Visit Diagnoses: Iritis - History of recurrent iritis since age 57.  HLA-B27 positive.  ANA 1: 80 nucleolar pattern ENA negative.  Patient has had 2 episodes of iritis since January.  She states she is having some irritation in her right eye which could be a beginning of iritis.  She has been followed closely by Dr. Rex Kras.  All autoimmune work-up has been negative except for her being positive for HLA-B27.  There is strong family history of autoimmune disease which include psoriasis in her brother and ulcerative colitis and her daughter.  She had no synovitis on examination today.  I had telephone conversation with Dr. Rex Kras today he felt that her episodes of iritis were mild and she does not need any DMARDs at this time based on her flares of iritis.  I gave her information on methotrexate which can be considered in the future if she has recurrent flares of iritis.  Primary osteoarthritis of both hands-knees.  I did not see any synovitis on examination.  Primary osteoarthritis of both feet-joint protection and proper fitting shoes were discussed.  DDD (degenerative disc disease), cervical-to have cervical spine discomfort and left-sided  radiculopathy.  She was seen by neurology.  MRI of C-spine was performed by neurology.  She will have follow-up visit with them.  DDD (degenerative disc disease), lumbar-chronic pain.  Idiopathic chronic gout of multiple sites without tophus-gout is well controlled.  Her uric acid is in desirable range.  Status post hip replacement, bilateral - [facility]Left total hip replacement 2018..  Doing well.  Family history of psoriasis in brother  Family history of ulcerative colitis - in her daughter   Other medical problems are listed as follows:  Essential hypertension  History of depression  History of sleep apnea  Vitamin deficiency     Orders: No orders of the defined types were placed in this encounter.  No orders of the defined types were placed in this encounter.   Face-to-face time spent with patient was 30 minutes. >50% of time was spent in counseling and coordination of care.  Follow-Up Instructions: Return if symptoms worsen or fail to improve, for Iritis, Osteoarthritis, Gout, DDD.   Bo Merino, MD  Note - This record has been created using Editor, commissioning.  Chart creation errors have been sought, but may not always  have been located. Such creation errors do not reflect on  the standard of medical care.

## 2017-07-25 ENCOUNTER — Encounter: Payer: Self-pay | Admitting: Rheumatology

## 2017-07-25 ENCOUNTER — Ambulatory Visit: Payer: Medicare HMO | Admitting: Rheumatology

## 2017-07-25 VITALS — BP 127/82 | HR 82 | Resp 15 | Ht 66.0 in | Wt 187.0 lb

## 2017-07-25 DIAGNOSIS — M19041 Primary osteoarthritis, right hand: Secondary | ICD-10-CM

## 2017-07-25 DIAGNOSIS — Z8379 Family history of other diseases of the digestive system: Secondary | ICD-10-CM | POA: Diagnosis not present

## 2017-07-25 DIAGNOSIS — Z96643 Presence of artificial hip joint, bilateral: Secondary | ICD-10-CM

## 2017-07-25 DIAGNOSIS — M5136 Other intervertebral disc degeneration, lumbar region: Secondary | ICD-10-CM

## 2017-07-25 DIAGNOSIS — H209 Unspecified iridocyclitis: Secondary | ICD-10-CM

## 2017-07-25 DIAGNOSIS — Z840 Family history of diseases of the skin and subcutaneous tissue: Secondary | ICD-10-CM | POA: Diagnosis not present

## 2017-07-25 DIAGNOSIS — M19042 Primary osteoarthritis, left hand: Secondary | ICD-10-CM | POA: Diagnosis not present

## 2017-07-25 DIAGNOSIS — M19072 Primary osteoarthritis, left ankle and foot: Secondary | ICD-10-CM | POA: Diagnosis not present

## 2017-07-25 DIAGNOSIS — M19071 Primary osteoarthritis, right ankle and foot: Secondary | ICD-10-CM | POA: Diagnosis not present

## 2017-07-25 DIAGNOSIS — M5030 Other cervical disc degeneration, unspecified cervical region: Secondary | ICD-10-CM

## 2017-07-25 DIAGNOSIS — M51369 Other intervertebral disc degeneration, lumbar region without mention of lumbar back pain or lower extremity pain: Secondary | ICD-10-CM

## 2017-07-25 DIAGNOSIS — M1A09X0 Idiopathic chronic gout, multiple sites, without tophus (tophi): Secondary | ICD-10-CM | POA: Diagnosis not present

## 2017-07-25 NOTE — Patient Instructions (Signed)
Methotrexate tablets What is this medicine? METHOTREXATE (METH oh TREX ate) is a chemotherapy drug used to treat cancer including breast cancer, leukemia, and lymphoma. This medicine can also be used to treat psoriasis and certain kinds of arthritis. This medicine may be used for other purposes; ask your health care provider or pharmacist if you have questions. COMMON BRAND NAME(S): Rheumatrex, Trexall What should I tell my health care provider before I take this medicine? They need to know if you have any of these conditions: -fluid in the stomach area or lungs -if you often drink alcohol -infection or immune system problems -kidney disease or on hemodialysis -liver disease -low blood counts, like low white cell, platelet, or red cell counts -lung disease -radiation therapy -stomach ulcers -ulcerative colitis -an unusual or allergic reaction to methotrexate, other medicines, foods, dyes, or preservatives -pregnant or trying to get pregnant -breast-feeding How should I use this medicine? Take this medicine by mouth with a glass of water. Follow the directions on the prescription label. Take your medicine at regular intervals. Do not take it more often than directed. Do not stop taking except on your doctor's advice. Make sure you know why you are taking this medicine and how often you should take it. If this medicine is used for a condition that is not cancer, like arthritis or psoriasis, it should be taken weekly, NOT daily. Taking this medicine more often than directed can cause serious side effects, even death. Talk to your healthcare provider about safe handling and disposal of this medicine. You may need to take special precautions. Talk to your pediatrician regarding the use of this medicine in children. While this drug may be prescribed for selected conditions, precautions do apply. Overdosage: If you think you have taken too much of this medicine contact a poison control center or  emergency room at once. NOTE: This medicine is only for you. Do not share this medicine with others. What if I miss a dose? If you miss a dose, talk with your doctor or health care professional. Do not take double or extra doses. What may interact with this medicine? This medicine may interact with the following medication: -acitretin -aspirin and aspirin-like medicines including salicylates -azathioprine -certain antibiotics like penicillins, tetracycline, and chloramphenicol -cyclosporine -gold -hydroxychloroquine -live virus vaccines -NSAIDs, medicines for pain and inflammation, like ibuprofen or naproxen -other cytotoxic agents -penicillamine -phenylbutazone -phenytoin -probenecid -retinoids such as isotretinoin and tretinoin -steroid medicines like prednisone or cortisone -sulfonamides like sulfasalazine and trimethoprim/sulfamethoxazole -theophylline This list may not describe all possible interactions. Give your health care provider a list of all the medicines, herbs, non-prescription drugs, or dietary supplements you use. Also tell them if you smoke, drink alcohol, or use illegal drugs. Some items may interact with your medicine. What should I watch for while using this medicine? Avoid alcoholic drinks. This medicine can make you more sensitive to the sun. Keep out of the sun. If you cannot avoid being in the sun, wear protective clothing and use sunscreen. Do not use sun lamps or tanning beds/booths. You may need blood work done while you are taking this medicine. Call your doctor or health care professional for advice if you get a fever, chills or sore throat, or other symptoms of a cold or flu. Do not treat yourself. This drug decreases your body's ability to fight infections. Try to avoid being around people who are sick. This medicine may increase your risk to bruise or bleed. Call your doctor or health care professional   if you notice any unusual bleeding. Check with your  doctor or health care professional if you get an attack of severe diarrhea, nausea and vomiting, or if you sweat a lot. The loss of too much body fluid can make it dangerous for you to take this medicine. Talk to your doctor about your risk of cancer. You may be more at risk for certain types of cancers if you take this medicine. Both men and women must use effective birth control with this medicine. Do not become pregnant while taking this medicine or until at least 1 normal menstrual cycle has occurred after stopping it. Women should inform their doctor if they wish to become pregnant or think they might be pregnant. Men should not father a child while taking this medicine and for 3 months after stopping it. There is a potential for serious side effects to an unborn child. Talk to your health care professional or pharmacist for more information. Do not breast-feed an infant while taking this medicine. What side effects may I notice from receiving this medicine? Side effects that you should report to your doctor or health care professional as soon as possible: -allergic reactions like skin rash, itching or hives, swelling of the face, lips, or tongue -breathing problems or shortness of breath -diarrhea -dry, nonproductive cough -low blood counts - this medicine may decrease the number of white blood cells, red blood cells and platelets. You may be at increased risk for infections and bleeding. -mouth sores -redness, blistering, peeling or loosening of the skin, including inside the mouth -signs of infection - fever or chills, cough, sore throat, pain or trouble passing urine -signs and symptoms of bleeding such as bloody or black, tarry stools; red or dark-brown urine; spitting up blood or brown material that looks like coffee grounds; red spots on the skin; unusual bruising or bleeding from the eye, gums, or nose -signs and symptoms of kidney injury like trouble passing urine or change in the amount  of urine -signs and symptoms of liver injury like dark yellow or brown urine; general ill feeling or flu-like symptoms; light-colored stools; loss of appetite; nausea; right upper belly pain; unusually weak or tired; yellowing of the eyes or skin Side effects that usually do not require medical attention (report to your doctor or health care professional if they continue or are bothersome): -dizziness -hair loss -tiredness -upset stomach -vomiting This list may not describe all possible side effects. Call your doctor for medical advice about side effects. You may report side effects to FDA at 1-800-FDA-1088. Where should I keep my medicine? Keep out of the reach of children. Store at room temperature between 20 and 25 degrees C (68 and 77 degrees F). Protect from light. Throw away any unused medicine after the expiration date. NOTE: This sheet is a summary. It may not cover all possible information. If you have questions about this medicine, talk to your doctor, pharmacist, or health care provider.  2018 Elsevier/Gold Standard (2014-09-27 05:39:22)  

## 2017-07-26 ENCOUNTER — Other Ambulatory Visit: Payer: Self-pay | Admitting: Neurology

## 2017-09-04 ENCOUNTER — Encounter: Payer: Self-pay | Admitting: Neurology

## 2017-09-04 ENCOUNTER — Ambulatory Visit: Payer: Medicare HMO | Admitting: Neurology

## 2017-09-04 VITALS — BP 121/67 | HR 72 | Ht 66.0 in | Wt 184.0 lb

## 2017-09-04 DIAGNOSIS — M5412 Radiculopathy, cervical region: Secondary | ICD-10-CM

## 2017-09-04 NOTE — Progress Notes (Signed)
PATIENT: [name] [name] DOB: 17-Feb-1960  Chief Complaint  Patient presents with  . Follow-up    pt alone, rm 4,pt states the pain in hand and neck is slightly better.. If she does something to aggravate it will flare up. pt states she has not been taking the gabapentin     HISTORICAL  [name] [name] is a 57 year old female, seen in refer by her primary care doctor Derek Jack for evaluation of neck pain, initial evaluation was on Jul 03, 2017.  She had a history of chronic neck pain, gradually getting worse over the past few years, since beginning of 2019, she noticed significant worsening, radiating pain from left neck to left shoulder, left arm, sometimes involving first 3 fingers, subjective weakness,  She had a history of left palm aneurysm repair surgery more than 10 years ago, she presented with left thumb embolic event, surgery has been helpful, current symptoms are different from her mild residual intermittent left thumb irritation,   She complains of her left neck pain 8 out of 10, sometimes trigger her headaches,  I reviewed EMG/NCS at [facility] hospital referred by her primary care physician Dr. Lendon Colonel on April 19, 2017, there was mildly increased insertional activity and mildly polyphasic motor unit in the left first dorsal interossei and triceps, and there was also mildly increased insertional activity in the left deltoid and the left C6 paraspinal muscles, which she raised the possibility of mild subacute left C7 radiculopathy, there is no evidence of left carpal tunnel or cubital tunnel syndromes on the left side.  X-ray of cervical report on May 16, 2017, loss of cervical lordosis, mild C5-6 narrowing with anterior spurring,  UPDATE September 04 2017: Her neck pain overall has mild improvement, still have constant low-grade radiating pain to left shoulder, certain posturing will trigger more severe pain, she denies significant left hand weakness, but has  intermittent paresthesia involving the first 3 fingers,  We personally reviewed MRI of cervical spine in June 2019 from [facility], multilevel degenerative changes, at the C5-6, right eccentric broad-based posterior disc osteophyte, narrowing the spinal canal, severe right and moderate left foraminal stenosis, C6-7, left paracentral foraminal disc extrusion lateral of the left lateral recess and left neural foramen, mildly impinge of the central left spinal cord, likely compressing the existing left C7 nerve roots.  There was no significant evidence of spinal cord compression, she denies gait abnormality, no bowel and bladder incontinence, dose have mild hyperreflexia on examination.    REVIEW OF SYSTEMS: Full 14 system review of systems performed and notable only for  As above ALLERGIES: Allergies  Allergen Reactions  . Sulfa Antibiotics Other (See Comments)    Flu like symptoms    HOME MEDICATIONS: Current Outpatient Medications  Medication Sig Dispense Refill  . acetaminophen (TYLENOL) 500 MG tablet Take 1,000 mg by mouth every 6 (six) hours as needed for mild pain or moderate pain.    Marland Kitchen allopurinol (ZYLOPRIM) 100 MG tablet Take 100 mg by mouth daily.    Marland Kitchen amLODipine (NORVASC) 5 MG tablet Take 5 mg by mouth daily.    . benzonatate (TESSALON) 200 MG capsule TAKE 1 CAPSULE (200 MG TOTAL) BY MOUTH 2 (TWO) TIMES DAILY AS NEEDED FOR COUGH.  5  . fluticasone (FLONASE) 50 MCG/ACT nasal spray USE 2 SPRAYS IN EACH NOSTRIL DAILY.    Marland Kitchen HYDROcodone-acetaminophen (NORCO/VICODIN) 5-325 MG tablet Take 1 tablet by mouth every 6 (six) hours as needed for moderate pain.    Marland Kitchen  IBUPROFEN IB PO Take by mouth as needed.    . meloxicam (MOBIC) 15 MG tablet Take 1 tablet (15 mg total) by mouth daily as needed for pain. 30 tablet 3  . Multiple Vitamin (MULTIVITAMIN WITH MINERALS) TABS tablet Take 1 tablet by mouth daily.    . mupirocin ointment (BACTROBAN) 2 %     . omeprazole (PRILOSEC) 40 MG capsule Take  40 mg by mouth daily.    . Pseudoephedrine-Acetaminophen (NASAL DECONGESTANT SINUS PO) Take 1 tablet by mouth every 6 (six) hours as needed (For congestion.).    Marland Kitchen. sertraline (ZOLOFT) 50 MG tablet Take 50 mg by mouth daily.     . triazolam (HALCION) 0.25 MG tablet Take by mouth.    . gabapentin (NEURONTIN) 300 MG capsule Take 1 capsule (300 mg total) by mouth 3 (three) times daily. (Patient not taking: Reported on 09/04/2017) 270 capsule 1  . oxyCODONE-acetaminophen (ROXICET) 5-325 MG tablet Take 1-2 tablets by mouth every 4 (four) hours as needed. (Patient not taking: Reported on 07/25/2017) 60 tablet 0   No current facility-administered medications for this visit.     PAST MEDICAL HISTORY: Past Medical History:  Diagnosis Date  . Aneurysm (HCC)    left hand"caused blood left thumb" surgery by Dr. Merlyn LotKuzma "repair of aneurysm"- all resolved with surgery  . Arthritis    arthritis-left hip, left ankle gout, bursitis left shoulder"recent injection 10-03-15"  . Bronchiectasis (HCC)    stable-sees [facility]-sees yearly"Asthma symptoms"  . Depression   . History of hiatal hernia   . Hypertension   . PONV (postoperative nausea and vomiting)   . Sleep apnea    cpap used, settings 9  . Vision abnormalities     PAST SURGICAL HISTORY: Past Surgical History:  Procedure Laterality Date  . ABDOMINAL HYSTERECTOMY    . CHOLECYSTECTOMY     laparoscopic  . CYSTOSCOPY    . HAND SURGERY Left    "aneurysm repair"  . HARDWARE REMOVAL Left 11/25/2015   Procedure: HARDWARE REMOVAL;  Surgeon: Kathryne Hitchhristopher Y Blackman, MD;  Location: [facility];  Service: Orthopedics;  Laterality: Left;  . HIP FRACTURE SURGERY Left   . JOINT REPLACEMENT Right    RTHA  . TONSILLECTOMY    . TOTAL HIP ARTHROPLASTY Left 11/25/2015   Procedure: REMOVAL OF CANNULATED SCREWS LEFT HIP AND LEFT TOTAL HIP ARTHROPLASTY ANTERIOR APPROACH;  Surgeon: Kathryne Hitchhristopher Y Blackman, MD;  Location: [facility];  Service: Orthopedics;   Laterality: Left;  . TUBAL LIGATION      FAMILY HISTORY: Family History  Problem Relation Age of Onset  . Heart disease Mother   . Heart disease Father   . Healthy Brother   . Ulcerative colitis Daughter     SOCIAL HISTORY:  Social History   Socioeconomic History  . Marital status: Married    Spouse name: Not on file  . Number of children: Not on file  . Years of education: Not on file  . Highest education level: Not on file  Occupational History  . Not on file  Social Needs  . Financial resource strain: Not on file  . Food insecurity:    Worry: Not on file    Inability: Not on file  . Transportation needs:    Medical: Not on file    Non-medical: Not on file  Tobacco Use  . Smoking status: Former Smoker    Packs/day: 0.50    Years: 4.00    Pack years: 2.00  Types: Cigarettes    Last attempt to quit: 05/17/1998    Years since quitting: 19.3  . Smokeless tobacco: Never Used  Substance and Sexual Activity  . Alcohol use: Yes    Comment: rare -social  . Drug use: Never  . Sexual activity: Yes  Lifestyle  . Physical activity:    Days per week: Not on file    Minutes per session: Not on file  . Stress: Not on file  Relationships  . Social connections:    Talks on phone: Not on file    Gets together: Not on file    Attends religious service: Not on file    Active member of club or organization: Not on file    Attends meetings of clubs or organizations: Not on file    Relationship status: Not on file  . Intimate partner violence:    Fear of current or ex partner: Not on file    Emotionally abused: Not on file    Physically abused: Not on file    Forced sexual activity: Not on file  Other Topics Concern  . Not on file  Social History Narrative  . Not on file     PHYSICAL EXAM   Vitals:   09/04/17 0711  BP: 121/67  Pulse: 72  Weight: 184 lb (83.5 kg)  Height: 5\' 6"  (1.676 m)    Not recorded      Body mass index is 29.7 kg/m.  PHYSICAL  EXAMNIATION:  Gen: NAD, conversant, well nourised, obese, well groomed                     Cardiovascular: Regular rate rhythm, no peripheral edema, warm, nontender. Eyes: Conjunctivae clear without exudates or hemorrhage Neck: Supple, no carotid bruits. Pulmonary: Clear to auscultation bilaterally   NEUROLOGICAL EXAM:  MENTAL STATUS: Speech:    Speech is normal; fluent and spontaneous with normal comprehension.  Cognition:     Orientation to time, place and person     Normal recent and remote memory     Normal Attention span and concentration     Normal Language, naming, repeating,spontaneous speech     Fund of knowledge   CRANIAL NERVES: CN II: Visual fields are full to confrontation. Fundoscopic exam is normal with sharp discs and no vascular changes. Pupils are round equal and briskly reactive to light. CN III, IV, VI: extraocular movement are normal. No ptosis. CN V: Facial sensation is intact to pinprick in all 3 divisions bilaterally. Corneal responses are intact.  CN VII: Face is symmetric with normal eye closure and smile. CN VIII: Hearing is normal to rubbing fingers CN IX, X: Palate elevates symmetrically. Phonation is normal. CN XI: Head turning and shoulder shrug are intact CN XII: Tongue is midline with normal movements and no atrophy.  MOTOR: There is no pronator drift of out-stretched arms. Muscle bulk and tone are normal. Muscle strength is normal.  REFLEXES: Reflexes are 2+ and symmetric at the biceps, triceps, knees, and ankles. Plantar responses are flexor.  SENSORY: Intact to light touch, pinprick, positional sensation and vibratory sensation are intact in fingers and toes.  COORDINATION: Rapid alternating movements and fine finger movements are intact. There is no dysmetria on finger-to-nose and heel-knee-shin.    GAIT/STANCE: Posture is normal. Gait is steady with normal steps, base, arm swing, and turning. Heel and toe walking are normal. Tandem gait  is normal.  Romberg is absent.   DIAGNOSTIC DATA (LABS, IMAGING, TESTING) - I  reviewed patient records, labs, notes, testing and imaging myself where available.   ASSESSMENT AND PLAN  [name] [name] is a 57 y.o. female   Left cervical radiculopathy  Involving left C6-7 level,  Gabapentin 300 mg 3 times daily, Mobic as needed  Moderate exercise,  No evidence of cervical myelopathy, advised her to call clinic for worsening symptoms,   Levert Feinstein, M.D. Ph.D.  [facility] [address] [place], [place] [address] Ph: [phone] Fax: [phone]  CC: Cheral Bay, MD

## 2018-08-02 IMAGING — DX DG HIP (WITH OR WITHOUT PELVIS) 1V PORT*L*
2 series · 2 of 2 positions shown · non-contrast
Comparison: Intraoperative images obtained earlier in the day

CLINICAL DATA: Status post total hip replacement on the left

EXAM:
DG HIP (WITH OR WITHOUT PELVIS) 2V PORT LEFT

[pelvis ap]
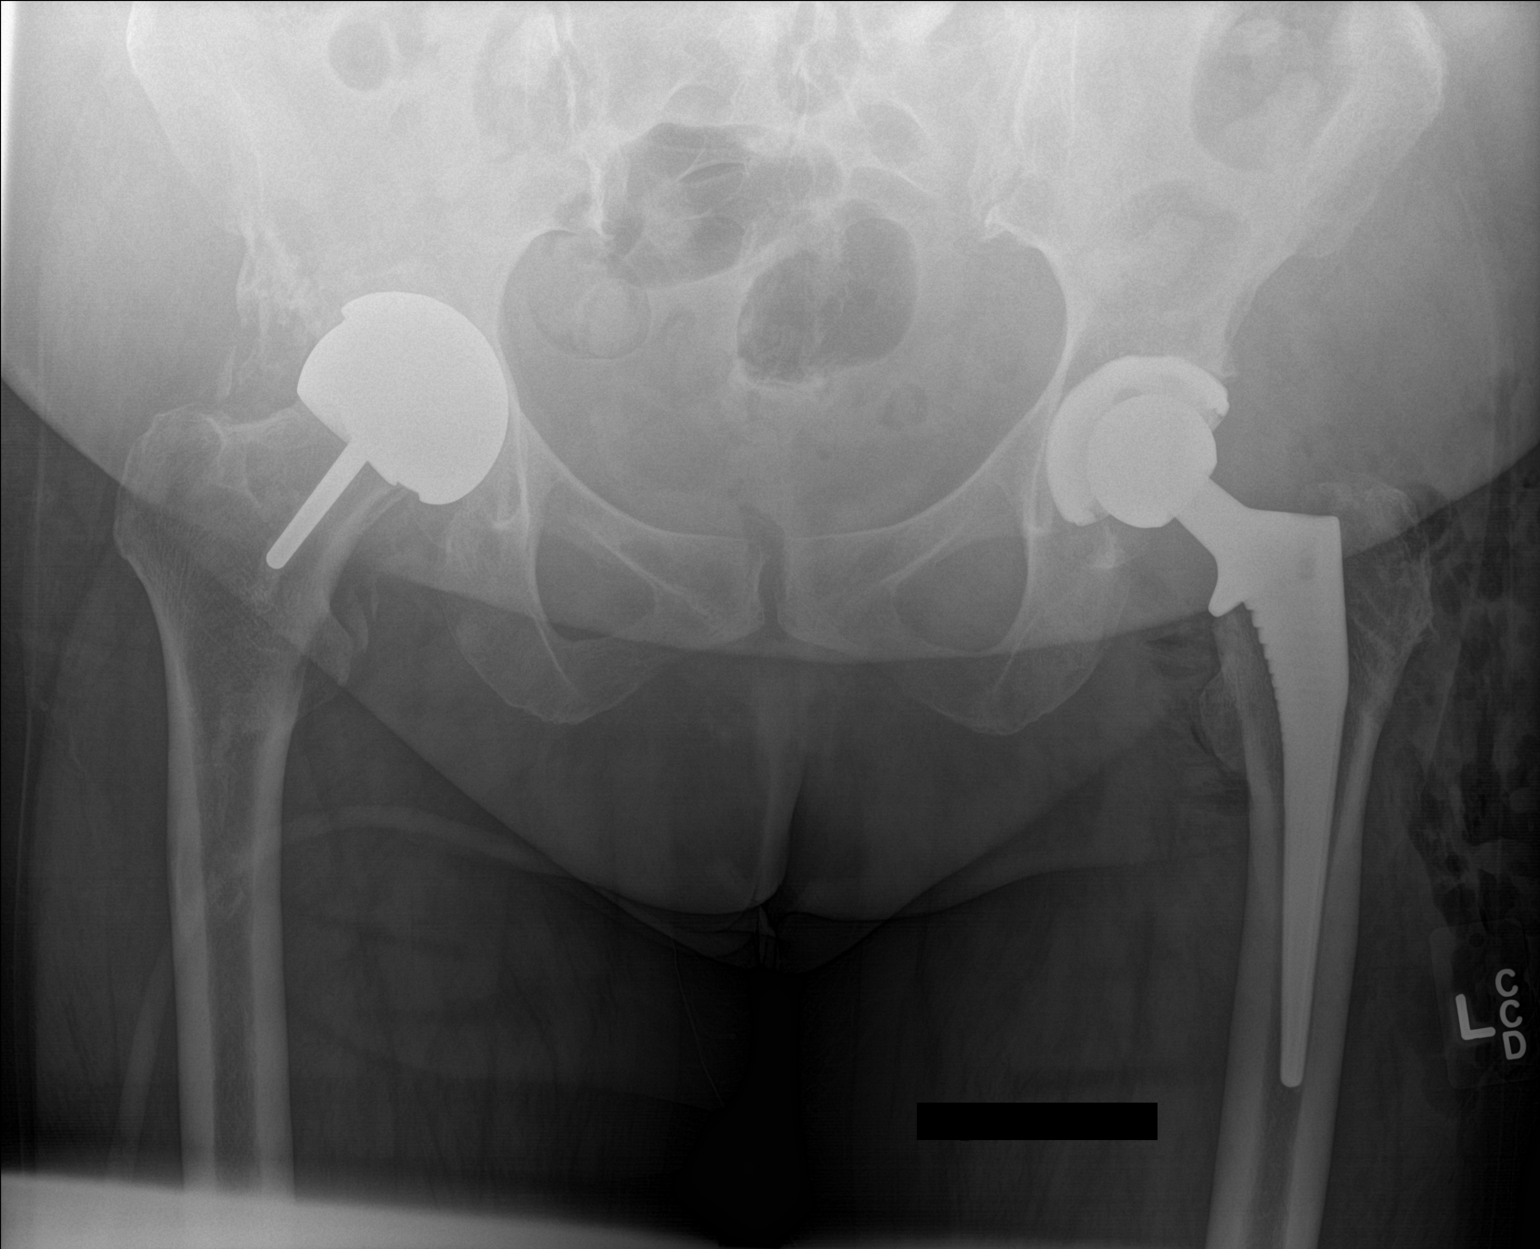

[hip lat]
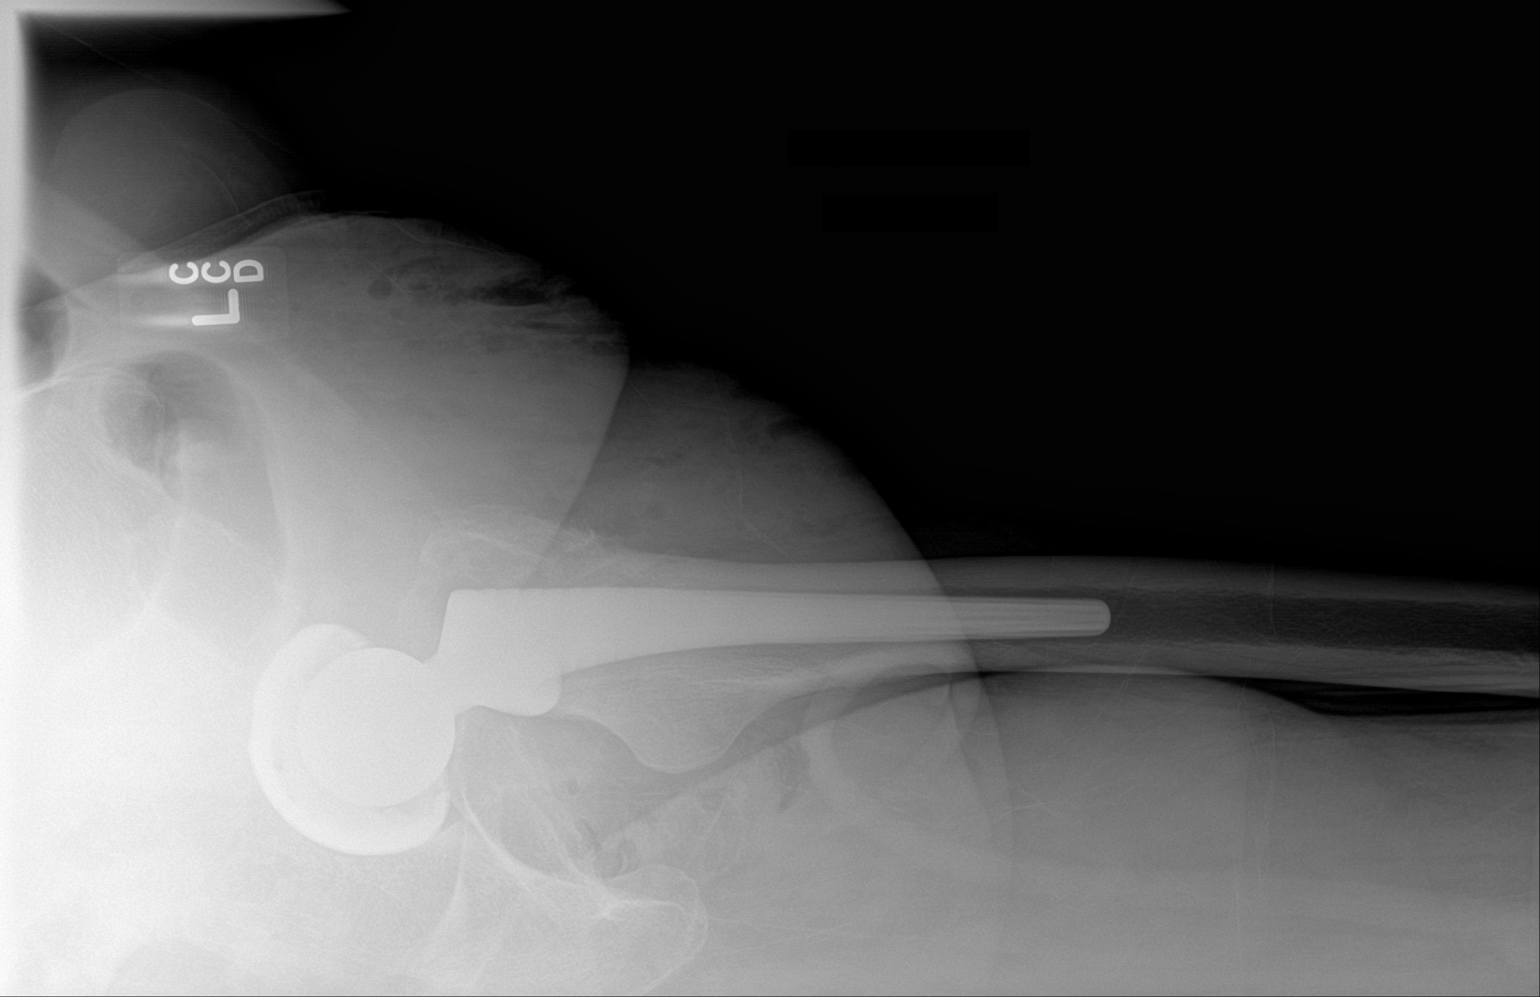

[2 of 2 positions shown; findings below may reference images not displayed]

FINDINGS: Frontal pelvis and lateral left hip images obtained. There is a
total hip prosthesis on the right. There is a new total hip
prosthesis on the left. Prosthetic components bilaterally appear
well seated. There is soft tissue air on the left, an expected
postoperative finding. No fracture or dislocation. Myositis
ossifications is noted lateral to the right hip joint. There is
borderline protrusio acetabuli on the right.
IMPRESSION: Total hip prostheses bilaterally with prosthetic components
appearing well-seated. No acute fracture or dislocation. Air on the
left is an expected postoperative finding. Myositis ossificans noted
laterally on the right.

## 2023-10-14 ENCOUNTER — Encounter: Payer: Self-pay | Admitting: Cardiology

## 2023-10-14 ENCOUNTER — Ambulatory Visit: Attending: Cardiology | Admitting: Cardiology

## 2023-10-14 VITALS — BP 118/72 | HR 73 | Ht 66.0 in | Wt 228.6 lb

## 2023-10-14 DIAGNOSIS — M79605 Pain in left leg: Secondary | ICD-10-CM | POA: Diagnosis present

## 2023-10-14 DIAGNOSIS — I8393 Asymptomatic varicose veins of bilateral lower extremities: Secondary | ICD-10-CM | POA: Diagnosis present

## 2023-10-14 DIAGNOSIS — I10 Essential (primary) hypertension: Secondary | ICD-10-CM | POA: Insufficient documentation

## 2023-10-14 DIAGNOSIS — M7989 Other specified soft tissue disorders: Secondary | ICD-10-CM | POA: Insufficient documentation

## 2023-10-14 DIAGNOSIS — R0602 Shortness of breath: Secondary | ICD-10-CM

## 2023-10-14 DIAGNOSIS — R002 Palpitations: Secondary | ICD-10-CM | POA: Insufficient documentation

## 2023-10-14 DIAGNOSIS — R0609 Other forms of dyspnea: Secondary | ICD-10-CM | POA: Diagnosis present

## 2023-10-14 DIAGNOSIS — I2510 Atherosclerotic heart disease of native coronary artery without angina pectoris: Secondary | ICD-10-CM | POA: Insufficient documentation

## 2023-10-14 DIAGNOSIS — E785 Hyperlipidemia, unspecified: Secondary | ICD-10-CM | POA: Insufficient documentation

## 2023-10-14 NOTE — Progress Notes (Unsigned)
 Cardiology Office Note:  .   Date:  10/14/2023  ID:  [name] [name], DOB 04/06/1960, MRN [id number] PCP: Lavell Ewing SAILOR, MD  [facility] Providers Cardiologist:  Alm Clay, MD { Click to update primary MD,subspecialty MD or APP then REFRESH:1}    No chief complaint on file.   Patient Profile: .     [name] [name] is a *** 63 y.o. female *** with a PMH notable for *** who presents here for *** at the request of Alray Loader, MD.  {There is no content from the last Narrative History section.}      [name] [name] was last seen on ***  Subjective  Discussed the use of AI scribe software for clinical note transcription with the patient, who gave verbal consent to proceed.  History of Present Illness      Cardiovascular ROS: {roscv:310661}  ROS:  Review of Systems - {ros master:310782}    Objective    Studies Reviewed: SABRA   EKG Interpretation Date/Time:  Monday October 14 2023 11:01:49 EDT Ventricular Rate:  73 PR Interval:  158 QRS Duration:  70 QT Interval:  386 QTC Calculation: 425 R Axis:   68  Text Interpretation: Normal sinus rhythm Normal ECG When compared with ECG of 12-Oct-2015 09:03, No significant change was found Confirmed by Clay Alm ([id number]) on 10/14/2023 12:03:44 PM    [facility]: Lipid Panel Component 03/08/22 06/28/20  CHOLESTEROL 176 174  TRIGLYCERIDES 174 High  132  HDL CHOLESTEROL 43 43  LDL CHOLESTEROL CALCULATED 98 105 High    Component 04/18/23 09/11/22  Sodium 139 138  Potassium 4.5  4.5   Chloride 102 101  CO2 28 29  Anion Gap 9 8  Glucose, Random 81 85  Blood Urea Nitrogen (BUN) 15 19  Creatinine 0.9 1.13  eGFR 71  54 Low    Albumin 4.5 4.5  Total Protein 7.3 7.2  Bilirubin, Total 0.7 0.8  Alkaline Phosphatase (ALP) 70 70  Aspartate Aminotransferase (AST) 12 Low  14  Alanine Aminotransferase (ALT) 10 14  Calcium 10.3 10   Results  ECHO: *** CATH: *** MONITOR: *** CT: ***  Risk  Assessment/Calculations:   {Does this patient have ATRIAL FIBRILLATION?:[phone]}           Physical Exam:   VS:  BP 118/72   Pulse 73   Ht 5' 6 (1.676 m)   Wt 228 lb 9.6 oz (103.7 kg)   SpO2 95%   BMI 36.90 kg/m    Wt Readings from Last 3 Encounters:  10/14/23 228 lb 9.6 oz (103.7 kg)  09/04/17 184 lb (83.5 kg)  07/25/17 187 lb (84.8 kg)    Physical Exam    GEN: Well nourished, well developed in no acute distress; *** NECK: No JVD; No carotid bruits CARDIAC: Normal S1, S2; RRR, no murmurs, rubs, gallops RESPIRATORY:  Clear to auscultation without rales, wheezing or rhonchi ; nonlabored, good air movement. ABDOMEN: Soft, non-tender, non-distended EXTREMITIES:  No edema; No deformity      ASSESSMENT AND PLAN: .    Problem List Items Addressed This Visit       Cardiology Problems   Essential hypertension - Primary   Relevant Medications   metoprolol succinate (TOPROL-XL) 25 MG 24 hr tablet   rosuvastatin (CRESTOR) 5 MG tablet   aspirin  EC 81 MG tablet   Other Relevant Orders   EKG 12-Lead (Completed)    Assessment and Plan Assessment & Plan        {  Are you ordering a CV Procedure (e.g. stress test, cath, DCCV, TEE, etc)?   Press F2        :[id number]}   Follow-Up: No follow-ups on file.  I spent *** minutes in the care of [employer] today including documenting in the encounter.      Signed, Alm MICAEL Clay, MD, MS Alm Clay, M.D., M.S. Interventional Cardiologist  [facility] Pager # [phone]

## 2023-10-14 NOTE — Patient Instructions (Addendum)
 Medication Instructions:   No changes  *If you need a refill on your cardiac medications before your next appointment, please call your pharmacy*   Lab Work: Not needed    Testing/Procedures: 1 ) Your physician has requested that you have a 1- lower  extremity venous duplex-- left leg -- for DVT and  2)bilateral extremities for reflux . This test is an ultrasound of the veins in the legs. It looks at venous blood flow that carries blood from the heart to the legs. Allow one hour for a Lower Venous exam.  There are no restrictions or special instructions.  Please note: We ask at that you not bring children with you during ultrasound (echo/ vascular) testing. Due to room size and safety concerns, children are not allowed in the ultrasound rooms during exams. Our front office staff cannot provide observation of children in our lobby area while testing is being conducted. An adult accompanying a patient to their appointment will only be allowed in the ultrasound room at the discretion of the ultrasound technician under special circumstances. We apologize for any inconvenience.   3) Your physician has requested that you have an echocardiogram. Echocardiography is a painless test that uses sound waves to create images of your heart. It provides your doctor with information about the size and shape of your heart and how well your heart's chambers and valves are working. This procedure takes approximately one hour. There are no restrictions for this procedure. Please do NOT wear cologne, perfume, aftershave, or lotions (deodorant is allowed). Please arrive 15 minutes prior to your appointment time.  Please note: We ask at that you not bring children with you during ultrasound (echo/ vascular) testing. Due to room size and safety concerns, children are not allowed in the ultrasound rooms during exams. Our front office staff cannot provide observation of children in our lobby area while testing is being  conducted. An adult accompanying a patient to their appointment will only be allowed in the ultrasound room at the discretion of the ultrasound technician under special circumstances. We apologize for any inconvenience.  4)CT coronary calcium score.   Test locations:  [facility] at [facility] [place]  [facility] at [facility]  This is $99 out of pocket.   Coronary CalciumScan A coronary calcium scan is an imaging test used to look for deposits of calcium and other fatty materials (plaques) in the inner lining of the blood vessels of the heart (coronary arteries). These deposits of calcium and plaques can partly clog and narrow the coronary arteries without producing any symptoms or warning signs. This puts a person at risk for a heart attack. This test can detect these deposits before symptoms develop. Tell a health care provider about: Any allergies you have. All medicines you are taking, including vitamins, herbs, eye drops, creams, and over-the-counter medicines. Any problems you or family members have had with anesthetic medicines. Any blood disorders you have. Any surgeries you have had. Any medical conditions you have. Whether you are pregnant or may be pregnant. What are the risks? Generally, this is a safe procedure. However, problems may occur, including: Harm to a pregnant woman and her unborn baby. This test involves the use of radiation. Radiation exposure can be dangerous to a pregnant woman and her unborn baby. If you are pregnant, you generally should not have this procedure done. Slight increase in the risk of cancer. This is because of  the radiation involved in the test. What happens before the procedure? No preparation is needed for this procedure. What happens during the procedure? You will undress and remove any jewelry around your neck or chest. You will put on a  hospital gown. Sticky electrodes will be placed on your chest. The electrodes will be connected to an electrocardiogram (ECG) machine to record a tracing of the electrical activity of your heart. A CT scanner will take pictures of your heart. During this time, you will be asked to lie still and hold your breath for 2-3 seconds while a picture of your heart is being taken. The procedure may vary among health care providers and hospitals. What happens after the procedure? You can get dressed. You can return to your normal activities. It is up to you to get the results of your test. Ask your health care provider, or the department that is doing the test, when your results will be ready. Summary A coronary calcium scan is an imaging test used to look for deposits of calcium and other fatty materials (plaques) in the inner lining of the blood vessels of the heart (coronary arteries). Generally, this is a safe procedure. Tell your health care provider if you are pregnant or may be pregnant. No preparation is needed for this procedure. A CT scanner will take pictures of your heart. You can return to your normal activities after the scan is done. This information is not intended to replace advice given to you by your health care provider. Make sure you discuss any questions you have with your health care provider. Document Released: 07/21/2007 Document Revised: 12/12/2015 Document Reviewed: 12/12/2015 Elsevier Interactive Patient Education  2017 [employer].  Follow-Up: At [facility], you and your health needs are our priority.  As part of our continuing mission to provide you with exceptional heart care, we have created designated Provider Care Teams.  These Care Teams include your primary Cardiologist (physician) and Advanced Practice Providers (APPs -  Physician Assistants and Nurse Practitioners) who all work together to provide you with the care you need, when you need it.     Your next  appointment:   2 month(s)  The format for your next appointment:   In Person  Provider:   Alm Clay, MD

## 2023-10-17 ENCOUNTER — Encounter: Payer: Self-pay | Admitting: Cardiology

## 2023-10-17 DIAGNOSIS — E785 Hyperlipidemia, unspecified: Secondary | ICD-10-CM | POA: Insufficient documentation

## 2023-10-17 DIAGNOSIS — M79605 Pain in left leg: Secondary | ICD-10-CM | POA: Insufficient documentation

## 2023-10-17 DIAGNOSIS — R002 Palpitations: Secondary | ICD-10-CM | POA: Insufficient documentation

## 2023-10-17 DIAGNOSIS — I2510 Atherosclerotic heart disease of native coronary artery without angina pectoris: Secondary | ICD-10-CM | POA: Insufficient documentation

## 2023-10-17 NOTE — Assessment & Plan Note (Signed)
 Left lower extremity swelling and pain due to chronic venous insufficiency Unilateral swelling and pain in the left lower extremity, primarily in the ankle area, exacerbated by prolonged sitting or travel. Differential includes chronic venous insufficiency and potential deep vein thrombosis (DVT). History of left hip replacement may contribute to venous insufficiency. - Order venous duplex ultrasound for both legs to assess for venous reflux and DVT in the left leg. - Recommend foot elevation to alleviate swelling.

## 2023-10-17 NOTE — Assessment & Plan Note (Signed)
 Managed with low-dose Crestor. Last cholesterol check a year ago showed LDL at 98 mg/dL. - Continue Crestor 5 mg daily. - Recheck cholesterol levels in February 2026. => May adjust goal of lipids based on Coronary Calcium Score.

## 2023-10-17 NOTE — Assessment & Plan Note (Signed)
 Chronic shortness of breath associated with bronchiectasis and obstructive sleep apnea. - Order echocardiogram to evaluate cardiac function and rule out cardiac causes of shortness of breath.

## 2023-10-17 NOTE — Assessment & Plan Note (Signed)
 Presence of aortic atherosclerosis and coronary artery calcification on CT scan. Risk factors include fluctuating blood pressure and family history of cardiac issues. Current management includes low-dose statin therapy. - Consider Coronary Calcium Score to assess the extent of coronary artery calcification. - Monitor and manage cardiac risk factors, including cholesterol and blood pressure levels.

## 2023-10-17 NOTE — Assessment & Plan Note (Signed)
 Fluctuating blood pressure, potentially exacerbated by stress. Managed with low-dose antihypertensive medication. - Continue current antihypertensive regimen: Amlodipine  5 mg daily and Toprol 12.5 mg daily - Monitor blood pressure regularly.

## 2023-10-17 NOTE — Assessment & Plan Note (Signed)
 Well-managed on Metoprolol Succinate (Toprol XL) 12.5 mg daily - Continue Toprol-XL as prescribed.

## 2023-10-22 ENCOUNTER — Ambulatory Visit (HOSPITAL_COMMUNITY)
Admission: RE | Admit: 2023-10-22 | Discharge: 2023-10-22 | Disposition: A | Discharge status: Home / Self Care | Payer: Self-pay | Source: Ambulatory Visit | Attending: Cardiovascular Disease | Admitting: Cardiovascular Disease

## 2023-10-22 DIAGNOSIS — I10 Essential (primary) hypertension: Secondary | ICD-10-CM | POA: Insufficient documentation

## 2023-10-22 DIAGNOSIS — R0609 Other forms of dyspnea: Secondary | ICD-10-CM | POA: Insufficient documentation

## 2023-10-24 ENCOUNTER — Ambulatory Visit: Payer: Self-pay | Admitting: Cardiology

## 2023-10-25 ENCOUNTER — Ambulatory Visit (HOSPITAL_COMMUNITY)
Admission: RE | Admit: 2023-10-25 | Discharge: 2023-10-25 | Disposition: A | Discharge status: Home / Self Care | Source: Ambulatory Visit | Attending: Cardiology | Admitting: Cardiology

## 2023-10-25 ENCOUNTER — Encounter (HOSPITAL_COMMUNITY): Payer: Self-pay

## 2023-10-25 ENCOUNTER — Ambulatory Visit (HOSPITAL_COMMUNITY)

## 2023-10-25 DIAGNOSIS — M7989 Other specified soft tissue disorders: Secondary | ICD-10-CM | POA: Diagnosis present

## 2023-10-25 DIAGNOSIS — I8393 Asymptomatic varicose veins of bilateral lower extremities: Secondary | ICD-10-CM | POA: Insufficient documentation

## 2023-11-21 ENCOUNTER — Ambulatory Visit (HOSPITAL_COMMUNITY)
Admission: RE | Admit: 2023-11-21 | Discharge: 2023-11-21 | Disposition: A | Discharge status: Home / Self Care | Source: Ambulatory Visit | Attending: Cardiology | Admitting: Cardiology

## 2023-11-21 DIAGNOSIS — R0600 Dyspnea, unspecified: Secondary | ICD-10-CM | POA: Diagnosis not present

## 2023-11-21 DIAGNOSIS — R0609 Other forms of dyspnea: Secondary | ICD-10-CM | POA: Diagnosis present

## 2023-11-21 LAB — ECHOCARDIOGRAM COMPLETE
Area-P 1/2: 3.77 cm2
S' Lateral: 3 cm

## 2023-12-16 ENCOUNTER — Ambulatory Visit: Attending: Cardiology | Admitting: Cardiology

## 2023-12-16 ENCOUNTER — Encounter: Payer: Self-pay | Admitting: Cardiology

## 2023-12-16 VITALS — BP 118/78 | HR 78 | Ht 66.0 in | Wt 234.8 lb

## 2023-12-16 DIAGNOSIS — R002 Palpitations: Secondary | ICD-10-CM | POA: Diagnosis present

## 2023-12-16 DIAGNOSIS — M7989 Other specified soft tissue disorders: Secondary | ICD-10-CM

## 2023-12-16 DIAGNOSIS — I2510 Atherosclerotic heart disease of native coronary artery without angina pectoris: Secondary | ICD-10-CM

## 2023-12-16 DIAGNOSIS — I10 Essential (primary) hypertension: Secondary | ICD-10-CM

## 2023-12-16 DIAGNOSIS — E785 Hyperlipidemia, unspecified: Secondary | ICD-10-CM | POA: Insufficient documentation

## 2023-12-16 DIAGNOSIS — M79605 Pain in left leg: Secondary | ICD-10-CM | POA: Insufficient documentation

## 2023-12-16 NOTE — Progress Notes (Signed)
 Cardiology Office Note:  .   Date:  12/21/2023  ID:  [name] [name], DOB [birth year], MRN [id number] PCP: Lavell Ewing SAILOR, MD  [facility] Providers Cardiologist:  Alm Clay, MD     Chief Complaint  Patient presents with   Follow-up    To discuss test results    Patient Profile: .     [name] [name] is a obese 63 y.o. female with a PMH notable for HTN, HLD, OSA, GERD with chronic cough from bronchiectasis along with OCD/MDD who presents here for follow-up visit at the request of Lavell Ewing SAILOR, MD.  [name] [name] is a wife of patient of mine Pierson.  She was referred for symptoms of exertional dyspnea with evidence of coronary artery calcification on CT scan-at the request of Alray Loader, MD.    [name] [name] was last seen on September 8 for evaluation of coronary calcium is not seen on CT scan of the chest.  She noted having fluctuating blood pressure issues and occasional palpitations but those are well-controlled with metoprolol.  Mild occasional ankle swelling.  She noted occasional episodes of chest discomfort but and exertional dyspnea along with chronic cough.  She has taken aspirin  80 mg, amlodipine  5 mg, Toprol 12.5 mg..  She is also taking rosuvastatin 5 mg daily for lipid panel showing LDL of 98. => We are Coronary Calcium Score to risk stratify for CAD but also ordered an echocardiogram due to edema and dyspnea.  Will also order lower extremity Dopplers to evaluate swelling.  Subjective  Discussed the use of AI scribe software for clinical note transcription with the patient, who gave verbal consent to proceed.  History of Present Illness LAYKIN [name] is a 63 year old female with coronary artery disease who presents for follow-up on recent diagnostic tests and management of cardiovascular health.  She is here to discuss the results of recent diagnostic tests, including a coronary calcium score, echocardiogram, and lower  extremity dopplers. Her coronary calcium score was 13. The most common place for plaque is in the LAD. She is currently on rosuvastatin 5 mg, amlodipine , and Toprol. She also takes aspirin .  She has a history of knee arthritis. She experiences swelling in her left foot, which she manages by elevating it. Her knee pain has significantly impacted her mobility.  Her husband has been experiencing depression since his heart attack and stroke, which has affected their lifestyle. She finds it challenging to engage in activities she used to do together, as he is less active and communicative. She is concerned about his mental health and the impact on their family life.  She tries to maintain physical activity by incorporating exercise into her routine, such as using a stationary bike and walking more during errands. She also uses support stockings to manage swelling during travel.    Objective   Pertinent CV medications noted above: Aspirin  81 mg daily, amlodipine  to 5 mg daily, Toprol-XL 12.5 mg and rosuvastatin 5 mg daily.  Studies Reviewed: SABRA        Labs still Pending  Results RADIOLOGY Coronary Calcium Score: 13 (10/25/2023)  DIAGNOSTIC Echocardiogram: Ejection fraction 55-60%, normal wall motion, normal diastolic parameters, normal relaxation, normal right ventricle, normal aortic valve, normal mitral valve, normal right atrial pressure (11/21/2023) Lower extremity venous Doppler: No evidence of deep vein thrombosis, competent veins, no venous insufficiency in large veins (10/25/2023)  Risk Assessment/Calculations:          Physical Exam:  VS:  BP 118/78   Pulse 78   Ht 5' 6 (1.676 m)   Wt 234 lb 12.8 oz (106.5 kg)   SpO2 95%   BMI 37.90 kg/m    Wt Readings from Last 3 Encounters:  12/16/23 234 lb 12.8 oz (106.5 kg)  10/14/23 228 lb 9.6 oz (103.7 kg)  09/04/17 184 lb (83.5 kg)     GEN: Well nourished, well groomed in no acute distress; moderately obese but otherwise  healthy NECK: No JVD; No carotid bruits CARDIAC: Normal S1, S2; RRR, no murmurs, rubs, gallops RESPIRATORY:  Clear to auscultation without rales, wheezing or rhonchi ; nonlabored, good air movement. ABDOMEN: Soft, non-tender, non-distended EXTREMITIES:  No edema; No deformity     ASSESSMENT AND PLAN: .    Problem List Items Addressed This Visit       Cardiology Problems   Coronary artery calcification seen on computed tomography - Primary (Chronic)   Minimal coronary artery calcification-very reassuring. Coronary calcium score of 13 indicates minimal plaque, likely in the LAD.  I explained the pathophysiology of atherosclerotic disease-CAD. No significant cardiac issues identified. Aspirin  not recommended due to low calcium score unless for other indications like stroke prophylaxis. - Continue rosuvastatin 5 mg daily. - Monitor cholesterol levels. - Consider aspirin  if indicated for other reasons.      Essential hypertension (Chronic)   Blood pressure is well-controlled with current medication regimen. - Continue amlodipine  5 mg and Toprol 12.5 mg daily.-Which is also being used for palpitations.      Hyperlipidemia with target LDL less than 100 (Chronic)   Currently managed with rosuvastatin. Cholesterol levels need monitoring to ensure she is not elevated. - Continue rosuvastatin 5 mg daily. - Schedule cholesterol check with PCP.        Other   Pain and swelling of left lower extremity   Swelling in the left foot, likely related to knee arthritis and travel. No evidence of venous insufficiency in major veins. - Use support stockings during travel. - Elevate left leg when possible.  Left knee osteoarthritis Increased blood flow and vein size around the knee likely due to arthritis. No evidence of venous insufficiency in major veins. - Elevate left leg when possible. - Consider support stockings during travel.      Palpitations (Chronic)   Well-controlled on Toprol-XL  12.5 mg.  If symptoms were to persist, would probably just simply increase Toprol-XL to full 25 mg.             Follow-Up: Return if symptoms worsen or fail to improve, for Followup when necessary.     Signed, Alm MICAEL Clay, MD, MS Alm Clay, M.D., M.S. Interventional Cardiologist  [facility] Pager # [phone]

## 2023-12-16 NOTE — Patient Instructions (Signed)
 Medication Instructions:   No changes     Lab Work: Not needed    Testing/Procedures:  Not needed  Follow-Up: At [facility], you and your health needs are our priority.  As part of our continuing mission to provide you with exceptional heart care, we have created designated Provider Care Teams.  These Care Teams include your primary Cardiologist (physician) and Advanced Practice Providers (APPs -  Physician Assistants and Nurse Practitioners) who all work together to provide you with the care you need, when you need it.     Your next appointment:   As needed   The format for your next appointment:   In Person  Provider:   Bryan Lemma, MD

## 2023-12-21 ENCOUNTER — Encounter: Payer: Self-pay | Admitting: Cardiology

## 2023-12-21 NOTE — Assessment & Plan Note (Signed)
 Blood pressure is well-controlled with current medication regimen. - Continue amlodipine  5 mg and Toprol 12.5 mg daily.-Which is also being used for palpitations.

## 2023-12-21 NOTE — Assessment & Plan Note (Signed)
 Currently managed with rosuvastatin. Cholesterol levels need monitoring to ensure she is not elevated. - Continue rosuvastatin 5 mg daily. - Schedule cholesterol check with PCP.

## 2023-12-21 NOTE — Assessment & Plan Note (Signed)
 Minimal coronary artery calcification-very reassuring. Coronary calcium score of 13 indicates minimal plaque, likely in the LAD.  I explained the pathophysiology of atherosclerotic disease-CAD. No significant cardiac issues identified. Aspirin  not recommended due to low calcium score unless for other indications like stroke prophylaxis. - Continue rosuvastatin 5 mg daily. - Monitor cholesterol levels. - Consider aspirin  if indicated for other reasons.

## 2023-12-21 NOTE — Assessment & Plan Note (Addendum)
 Well-controlled on Toprol-XL 12.5 mg.  If symptoms were to persist, would probably just simply increase Toprol-XL to full 25 mg.

## 2023-12-21 NOTE — Assessment & Plan Note (Signed)
 Swelling in the left foot, likely related to knee arthritis and travel. No evidence of venous insufficiency in major veins. - Use support stockings during travel. - Elevate left leg when possible.  Left knee osteoarthritis Increased blood flow and vein size around the knee likely due to arthritis. No evidence of venous insufficiency in major veins. - Elevate left leg when possible. - Consider support stockings during travel.
